# Patient Record
Sex: Female | Born: 1990 | Race: Black or African American | Hispanic: No | Marital: Single | Smoking: Never smoker
Health system: Northeastern US, Academic
[De-identification: ages and names within clinical notes are randomized; demographics above are authoritative.]

## PROBLEM LIST (undated history)

## (undated) ENCOUNTER — Encounter

## (undated) DIAGNOSIS — O020 Blighted ovum and nonhydatidiform mole: Secondary | ICD-10-CM

---

## 2013-02-18 NOTE — L&D Delivery Note (Signed)
Delivery Note At [phone] a healthy female was delivered via spontaneous vaginal delivery (Presentation: OA ;  ). .   Placenta status: intact.  Cord: 3 vessel  Called at 0645 with alert that patient was pushing. Arrived in room at 0647 to find baby delivered in bed, nursing staff attended delivery. Cord was cut and clamped by nursing staff. Placenta was delivered with gentle traction. Blood loss was minimal. Uterus was firm. There was a small perineal laceration that was not bleeding that did not require repair. No other lacerations noted. Bleeding was stopped. Mom and baby in good condition. Ready for transfer to postpartum  Anesthesia:  Fentanyl Episiotomy: None Lacerations: perineal 1st degree Suture Repair: none Est. Blood Loss (mL): 100  Mom to postpartum.  Baby to Nursery.  [name] attended delivery [name], [name] Surgeon A 09/25/2013, 6:59 AM

## 2013-02-18 NOTE — L&D Delivery Note (Signed)
I have seen and examined this patient and I agree with the above. Cam HaiSHAW, Tehila Sokolow CNM 8:49 AM 09/25/2013

## 2013-05-31 ENCOUNTER — Other Ambulatory Visit (HOSPITAL_COMMUNITY): Payer: Self-pay | Admitting: Nurse Practitioner

## 2013-05-31 DIAGNOSIS — Z3689 Encounter for other specified antenatal screening: Secondary | ICD-10-CM

## 2013-05-31 LAB — OB RESULTS CONSOLE GC/CHLAMYDIA: Gonorrhea: NEGATIVE

## 2013-05-31 LAB — OB RESULTS CONSOLE HGB/HCT, BLOOD
HEMATOCRIT: 34 %
Hemoglobin: 11 g/dL

## 2013-05-31 LAB — OB RESULTS CONSOLE RPR: RPR: NONREACTIVE

## 2013-05-31 LAB — GLUCOSE TOLERANCE, 1 HOUR: Glucose, 1 Hour GTT: 100

## 2013-05-31 LAB — OB RESULTS CONSOLE RUBELLA ANTIBODY, IGM: RUBELLA: IMMUNE

## 2013-05-31 LAB — OB RESULTS CONSOLE ABO/RH: RH Type: POSITIVE

## 2013-05-31 LAB — CULTURE, OB URINE: Urine Culture, OB: NEGATIVE

## 2013-05-31 LAB — OB RESULTS CONSOLE VARICELLA ZOSTER ANTIBODY, IGG: Varicella: IMMUNE

## 2013-05-31 LAB — OB RESULTS CONSOLE PLATELET COUNT: Platelets: 199 10*3/uL

## 2013-05-31 LAB — OB RESULTS CONSOLE HEPATITIS B SURFACE ANTIGEN: Hepatitis B Surface Ag: NEGATIVE

## 2013-05-31 LAB — DRUG SCREEN, URINE: Drug Screen, Urine: NEGATIVE

## 2013-05-31 LAB — CYSTIC FIBROSIS DIAGNOSTIC STUDY: INTERPRETATION-CFDNA: NEGATIVE

## 2013-05-31 LAB — OB RESULTS CONSOLE HIV ANTIBODY (ROUTINE TESTING): HIV: NONREACTIVE

## 2013-05-31 LAB — OB RESULTS CONSOLE ANTIBODY SCREEN: Antibody Screen: NEGATIVE

## 2013-05-31 LAB — SICKLE CELL SCREEN: SICKLE CELL SCREEN: NORMAL

## 2013-05-31 LAB — CYTOLOGY - PAP: PAP SMEAR: NEGATIVE

## 2013-06-07 ENCOUNTER — Ambulatory Visit (HOSPITAL_COMMUNITY)
Admission: RE | Admit: 2013-06-07 | Discharge: 2013-06-07 | Disposition: A | Discharge status: Home / Self Care | Payer: Medicaid Other | Source: Ambulatory Visit | Attending: Nurse Practitioner | Admitting: Nurse Practitioner

## 2013-06-07 ENCOUNTER — Other Ambulatory Visit (HOSPITAL_COMMUNITY): Payer: Self-pay | Admitting: Nurse Practitioner

## 2013-06-07 ENCOUNTER — Encounter (HOSPITAL_COMMUNITY): Payer: Self-pay

## 2013-06-07 DIAGNOSIS — Z3689 Encounter for other specified antenatal screening: Secondary | ICD-10-CM

## 2013-06-14 ENCOUNTER — Other Ambulatory Visit (HOSPITAL_COMMUNITY): Payer: Self-pay | Admitting: Nurse Practitioner

## 2013-06-14 DIAGNOSIS — Z0374 Encounter for suspected problem with fetal growth ruled out: Secondary | ICD-10-CM

## 2013-06-28 ENCOUNTER — Ambulatory Visit (HOSPITAL_COMMUNITY)
Admission: RE | Admit: 2013-06-28 | Discharge: 2013-06-28 | Disposition: A | Discharge status: Home / Self Care | Payer: Medicaid Other | Source: Ambulatory Visit | Attending: Nurse Practitioner | Admitting: Nurse Practitioner

## 2013-06-28 ENCOUNTER — Other Ambulatory Visit (HOSPITAL_COMMUNITY): Payer: Self-pay | Admitting: Nurse Practitioner

## 2013-06-28 DIAGNOSIS — Z0374 Encounter for suspected problem with fetal growth ruled out: Secondary | ICD-10-CM | POA: Diagnosis not present

## 2013-06-28 DIAGNOSIS — Z3689 Encounter for other specified antenatal screening: Secondary | ICD-10-CM | POA: Diagnosis not present

## 2013-06-28 LAB — OB RESULTS CONSOLE GC/CHLAMYDIA: Chlamydia: NEGATIVE

## 2013-07-02 ENCOUNTER — Other Ambulatory Visit (HOSPITAL_COMMUNITY): Payer: Self-pay | Admitting: Nurse Practitioner

## 2013-07-02 DIAGNOSIS — O365990 Maternal care for other known or suspected poor fetal growth, unspecified trimester, not applicable or unspecified: Secondary | ICD-10-CM

## 2013-07-05 ENCOUNTER — Ambulatory Visit (HOSPITAL_COMMUNITY)
Admission: RE | Admit: 2013-07-05 | Discharge: 2013-07-05 | Disposition: A | Discharge status: Home / Self Care | Payer: Medicaid Other | Source: Ambulatory Visit | Attending: Nurse Practitioner | Admitting: Nurse Practitioner

## 2013-07-05 ENCOUNTER — Encounter (HOSPITAL_COMMUNITY): Payer: Self-pay

## 2013-07-05 DIAGNOSIS — Z3689 Encounter for other specified antenatal screening: Secondary | ICD-10-CM | POA: Insufficient documentation

## 2013-07-05 DIAGNOSIS — O365990 Maternal care for other known or suspected poor fetal growth, unspecified trimester, not applicable or unspecified: Secondary | ICD-10-CM | POA: Insufficient documentation

## 2013-07-05 LAB — GLUCOSE TOLERANCE, 1 HOUR: Glucose, 1 Hour GTT: 86

## 2013-07-05 LAB — OB RESULTS CONSOLE HGB/HCT, BLOOD
HCT: 33 %
Hemoglobin: 10.8 g/dL

## 2013-07-13 ENCOUNTER — Ambulatory Visit (HOSPITAL_COMMUNITY)
Admission: RE | Admit: 2013-07-13 | Discharge: 2013-07-13 | Disposition: A | Discharge status: Home / Self Care | Payer: Medicaid Other | Source: Ambulatory Visit | Attending: Nurse Practitioner | Admitting: Nurse Practitioner

## 2013-07-13 ENCOUNTER — Encounter (HOSPITAL_COMMUNITY): Payer: Self-pay

## 2013-07-13 VITALS — BP 107/62 | HR 89 | Wt 194.0 lb

## 2013-07-13 DIAGNOSIS — Z3689 Encounter for other specified antenatal screening: Secondary | ICD-10-CM | POA: Insufficient documentation

## 2013-07-13 DIAGNOSIS — O365990 Maternal care for other known or suspected poor fetal growth, unspecified trimester, not applicable or unspecified: Secondary | ICD-10-CM

## 2013-07-13 NOTE — Progress Notes (Signed)
[facility] ultrasound  Indication: 23 yr old G3P1011 at [redacted]w[redacted]d by early ultrasound with concern for lagging fetal growth for BPP and Doppler studies.  Findings: 1. Single intrauterine pregnancy. 2. Posterior placenta without evidence of previa. 3. Normal amniotic fluid volume. 4. Normal umbilical artery Doppler studies. 5. Biophysical profile is 6/8 (-2 for breathing).  Recommendations: 1. Concern for lagging fetal growth: - new due date discovered in patient's records from early ultrasound - repeat fetal growth in 1 week using new due date 2. Overall  BPP 8/10 (reactive with 10x10 accels for <32 weeks)  Eulis Foster, MD

## 2013-07-14 ENCOUNTER — Encounter: Payer: Self-pay | Admitting: *Deleted

## 2013-07-15 ENCOUNTER — Encounter: Payer: Self-pay | Admitting: Family

## 2013-07-15 ENCOUNTER — Ambulatory Visit (INDEPENDENT_AMBULATORY_CARE_PROVIDER_SITE_OTHER): Payer: Medicaid Other | Admitting: Family

## 2013-07-15 VITALS — BP 117/73 | HR 104 | Temp 98.5°F | Ht 63.0 in | Wt 192.5 lb

## 2013-07-15 DIAGNOSIS — O0990 Supervision of high risk pregnancy, unspecified, unspecified trimester: Secondary | ICD-10-CM | POA: Insufficient documentation

## 2013-07-15 DIAGNOSIS — O365990 Maternal care for other known or suspected poor fetal growth, unspecified trimester, not applicable or unspecified: Secondary | ICD-10-CM

## 2013-07-15 LAB — POCT URINALYSIS DIP (DEVICE)
Bilirubin Urine: NEGATIVE
GLUCOSE, UA: NEGATIVE mg/dL
HGB URINE DIPSTICK: NEGATIVE
KETONES UR: NEGATIVE mg/dL
Leukocytes, UA: NEGATIVE
Nitrite: NEGATIVE
Protein, ur: 30 mg/dL — AB
SPECIFIC GRAVITY, URINE: 1.015 (ref 1.005–1.030)
UROBILINOGEN UA: 0.2 mg/dL (ref 0.0–1.0)
pH: 7.5 (ref 5.0–8.0)

## 2013-07-15 NOTE — Progress Notes (Signed)
[facility], getting weekly BPPs/dopplers, otherwise pregnancy uncomplicated.  Reviewed labs and updated problem list.  Next BPP on 07/20/13

## 2013-07-15 NOTE — Progress Notes (Signed)
Initial visit. Transfer from [facility]. Up to date on all labs.  Reports occasional edema in feet.  New OB packet given.

## 2013-07-20 ENCOUNTER — Ambulatory Visit (HOSPITAL_COMMUNITY)
Admission: RE | Admit: 2013-07-20 | Discharge: 2013-07-20 | Disposition: A | Discharge status: Home / Self Care | Payer: Medicaid Other | Attending: Family | Admitting: Family

## 2013-07-20 ENCOUNTER — Encounter (HOSPITAL_COMMUNITY): Payer: Self-pay

## 2013-07-20 DIAGNOSIS — O365990 Maternal care for other known or suspected poor fetal growth, unspecified trimester, not applicable or unspecified: Secondary | ICD-10-CM | POA: Insufficient documentation

## 2013-07-20 DIAGNOSIS — Z3689 Encounter for other specified antenatal screening: Secondary | ICD-10-CM | POA: Insufficient documentation

## 2013-07-29 ENCOUNTER — Ambulatory Visit (INDEPENDENT_AMBULATORY_CARE_PROVIDER_SITE_OTHER): Payer: Medicaid Other | Admitting: Obstetrics and Gynecology

## 2013-07-29 ENCOUNTER — Encounter: Payer: Self-pay | Admitting: Obstetrics and Gynecology

## 2013-07-29 VITALS — BP 120/82 | HR 109 | Temp 97.0°F | Wt 190.6 lb

## 2013-07-29 DIAGNOSIS — O0990 Supervision of high risk pregnancy, unspecified, unspecified trimester: Secondary | ICD-10-CM

## 2013-07-29 DIAGNOSIS — O365990 Maternal care for other known or suspected poor fetal growth, unspecified trimester, not applicable or unspecified: Secondary | ICD-10-CM

## 2013-07-29 LAB — POCT URINALYSIS DIP (DEVICE)
BILIRUBIN URINE: NEGATIVE
Glucose, UA: NEGATIVE mg/dL
HGB URINE DIPSTICK: NEGATIVE
KETONES UR: NEGATIVE mg/dL
NITRITE: NEGATIVE
Protein, ur: 30 mg/dL — AB
SPECIFIC GRAVITY, URINE: 1.025 (ref 1.005–1.030)
Urobilinogen, UA: 0.2 mg/dL (ref 0.0–1.0)
pH: 7 (ref 5.0–8.0)

## 2013-07-29 NOTE — Progress Notes (Signed)
EFW 51%tile on 07/20/2013- no need for further growth ultrasound as per MFM. FM/PTL precautions reviewed. Reassurance provided regarding edema.  NST reviewed and reactive. No further need for fetal monitoring at this time

## 2013-07-29 NOTE — Progress Notes (Signed)
Reports pain in left breast "nipple was so hard and I was feeling intense pain" yesterday-- patient reports massaging breast and pain eventually subsided. Reports edema in hands and feet.

## 2013-08-12 ENCOUNTER — Ambulatory Visit (INDEPENDENT_AMBULATORY_CARE_PROVIDER_SITE_OTHER): Payer: Medicaid Other | Admitting: Advanced Practice Midwife

## 2013-08-12 VITALS — BP 103/70 | HR 94 | Temp 97.6°F | Wt 192.4 lb

## 2013-08-12 DIAGNOSIS — O365990 Maternal care for other known or suspected poor fetal growth, unspecified trimester, not applicable or unspecified: Secondary | ICD-10-CM

## 2013-08-12 DIAGNOSIS — R42 Dizziness and giddiness: Secondary | ICD-10-CM

## 2013-08-12 LAB — POCT URINALYSIS DIP (DEVICE)
BILIRUBIN URINE: NEGATIVE
Glucose, UA: NEGATIVE mg/dL
Hgb urine dipstick: NEGATIVE
KETONES UR: NEGATIVE mg/dL
LEUKOCYTES UA: NEGATIVE
Nitrite: NEGATIVE
PH: 7 (ref 5.0–8.0)
Protein, ur: NEGATIVE mg/dL
SPECIFIC GRAVITY, URINE: 1.01 (ref 1.005–1.030)
Urobilinogen, UA: 0.2 mg/dL (ref 0.0–1.0)

## 2013-08-12 LAB — CBC
HEMATOCRIT: 30.3 % — AB (ref 36.0–46.0)
HEMOGLOBIN: 10.2 g/dL — AB (ref 12.0–15.0)
MCH: 26.6 pg (ref 26.0–34.0)
MCHC: 33.7 g/dL (ref 30.0–36.0)
MCV: 78.9 fL (ref 78.0–100.0)
Platelets: 157 10*3/uL (ref 150–400)
RBC: 3.84 MIL/uL — ABNORMAL LOW (ref 3.87–5.11)
RDW: 14.3 % (ref 11.5–15.5)
WBC: 8 10*3/uL (ref 4.0–10.5)

## 2013-08-12 NOTE — Progress Notes (Signed)
Doing well.  Good fetal movement, denies vaginal bleeding, LOF, regular contractions. Does report intermittent dizziness when up and walking and especially in heat, even when well hydrated.  Reports she has been anemic in the past and [facility] (hgb 11).  She is taking PNV daily.  CBC today.

## 2013-08-17 ENCOUNTER — Telehealth: Payer: Self-pay | Admitting: General Practice

## 2013-08-17 NOTE — Telephone Encounter (Signed)
Message copied by Kathee DeltonHILLMAN, CARRIE L on Tue Aug 17, 2013  8:06 AM ------      Message from: LEFTWICH-KIRBY, LISA A      Created: Fri Aug 13, 2013 10:30 AM       G3P1011 @[redacted]w[redacted]d  reported in her clinic visit on 6/25 that she was having occasional dizziness when standing.  She indicated that she had a hx of anemia.  CBC done that day indicates hgb of 10.2, which is only mildly anemic.  Usually we do not treat, but she may take OTC iron supplement, in addition to PNV to see if her symptoms improve. Please call to let her know results.   Thank you. ------

## 2013-08-17 NOTE — Telephone Encounter (Signed)
Called patient, no answer- left message that we are trying to reach you with some results, nothing urgent but please give us a call back at the clinics

## 2013-08-18 ENCOUNTER — Encounter: Payer: Self-pay | Admitting: General Practice

## 2013-08-18 NOTE — Telephone Encounter (Addendum)
Called patient, no answer- left message that we are trying to reach you with some results, nothing urgent but please call us back at the clinics. Will send letter 

## 2013-09-01 ENCOUNTER — Encounter: Payer: Medicaid Other | Admitting: Family

## 2013-09-02 ENCOUNTER — Ambulatory Visit (INDEPENDENT_AMBULATORY_CARE_PROVIDER_SITE_OTHER): Payer: Medicaid Other | Admitting: Advanced Practice Midwife

## 2013-09-02 VITALS — BP 125/77 | HR 93 | Temp 97.9°F | Wt 197.1 lb

## 2013-09-02 DIAGNOSIS — Z3480 Encounter for supervision of other normal pregnancy, unspecified trimester: Secondary | ICD-10-CM

## 2013-09-02 DIAGNOSIS — Z3493 Encounter for supervision of normal pregnancy, unspecified, third trimester: Secondary | ICD-10-CM

## 2013-09-02 LAB — POCT URINALYSIS DIP (DEVICE)
Bilirubin Urine: NEGATIVE
GLUCOSE, UA: NEGATIVE mg/dL
Hgb urine dipstick: NEGATIVE
KETONES UR: NEGATIVE mg/dL
LEUKOCYTES UA: NEGATIVE
Nitrite: NEGATIVE
Protein, ur: NEGATIVE mg/dL
SPECIFIC GRAVITY, URINE: 1.01 (ref 1.005–1.030)
Urobilinogen, UA: 0.2 mg/dL (ref 0.0–1.0)
pH: 7 (ref 5.0–8.0)

## 2013-09-02 LAB — OB RESULTS CONSOLE GC/CHLAMYDIA
CHLAMYDIA, DNA PROBE: NEGATIVE
Gonorrhea: NEGATIVE

## 2013-09-02 LAB — OB RESULTS CONSOLE GBS: GBS: POSITIVE

## 2013-09-02 NOTE — Patient Instructions (Signed)
Third Trimester of Pregnancy The third trimester is from week 29 through week 42, months 7 through 9. The third trimester is a time when the fetus is growing rapidly. At the end of the ninth month, the fetus is about 20 inches in length and weighs 6-10 pounds.  BODY CHANGES Your body goes through many changes during pregnancy. The changes vary from woman to woman.   Your weight will continue to increase. You can expect to gain 25-35 pounds (11-16 kg) by the end of the pregnancy.  You may begin to get stretch marks on your hips, abdomen, and breasts.  You may urinate more often because the fetus is moving lower into your pelvis and pressing on your bladder.  You may develop or continue to have heartburn as a result of your pregnancy.  You may develop constipation because certain hormones are causing the muscles that push waste through your intestines to slow down.  You may develop hemorrhoids or swollen, bulging veins (varicose veins).  You may have pelvic pain because of the weight gain and pregnancy hormones relaxing your joints between the bones in your pelvis. Backaches may result from overexertion of the muscles supporting your posture.  You may have changes in your hair. These can include thickening of your hair, rapid growth, and changes in texture. Some women also have hair loss during or after pregnancy, or hair that feels dry or thin. Your hair will most likely return to normal after your baby is born.  Your breasts will continue to grow and be tender. A yellow discharge may leak from your breasts called colostrum.  Your belly button may stick out.  You may feel short of breath because of your expanding uterus.  You may notice the fetus "dropping," or moving lower in your abdomen.  You may have a bloody mucus discharge. This usually occurs a few days to a week before labor begins.  Your cervix becomes thin and soft (effaced) near your due date. WHAT TO EXPECT AT YOUR PRENATAL  EXAMS  You will have prenatal exams every 2 weeks until week 36. Then, you will have weekly prenatal exams. During a routine prenatal visit:  You will be weighed to make sure you and the fetus are growing normally.  Your blood pressure is taken.  Your abdomen will be measured to track your baby's growth.  The fetal heartbeat will be listened to.  Any test results from the previous visit will be discussed.  You may have a cervical check near your due date to see if you have effaced. At around 36 weeks, your caregiver will check your cervix. At the same time, your caregiver will also perform a test on the secretions of the vaginal tissue. This test is to determine if a type of bacteria, Group B streptococcus, is present. Your caregiver will explain this further. Your caregiver may ask you:  What your birth plan is.  How you are feeling.  If you are feeling the baby move.  If you have had any abnormal symptoms, such as leaking fluid, bleeding, severe headaches, or abdominal cramping.  If you have any questions. Other tests or screenings that may be performed during your third trimester include:  Blood tests that check for low iron levels (anemia).  Fetal testing to check the health, activity level, and growth of the fetus. Testing is done if you have certain medical conditions or if there are problems during the pregnancy. FALSE LABOR You may feel small, irregular contractions that   eventually go away. These are called Braxton Hicks contractions, or false labor. Contractions may last for hours, days, or even weeks before true labor sets in. If contractions come at regular intervals, intensify, or become painful, it is best to be seen by your caregiver.  SIGNS OF LABOR   Menstrual-like cramps.  Contractions that are 5 minutes apart or less.  Contractions that start on the top of the uterus and spread down to the lower abdomen and back.  A sense of increased pelvic pressure or back  pain.  A watery or bloody mucus discharge that comes from the vagina. If you have any of these signs before the 37th week of pregnancy, call your caregiver right away. You need to go to the hospital to get checked immediately. HOME CARE INSTRUCTIONS   Avoid all smoking, herbs, alcohol, and unprescribed drugs. These chemicals affect the formation and growth of the baby.  Follow your caregiver's instructions regarding medicine use. There are medicines that are either safe or unsafe to take during pregnancy.  Exercise only as directed by your caregiver. Experiencing uterine cramps is a good sign to stop exercising.  Continue to eat regular, healthy meals.  Wear a good support bra for breast tenderness.  Do not use hot tubs, steam rooms, or saunas.  Wear your seat belt at all times when driving.  Avoid raw meat, uncooked cheese, cat litter boxes, and soil used by cats. These carry germs that can cause birth defects in the baby.  Take your prenatal vitamins.  Try taking a stool softener (if your caregiver approves) if you develop constipation. Eat more high-fiber foods, such as fresh vegetables or fruit and whole grains. Drink plenty of fluids to keep your urine clear or pale yellow.  Take warm sitz baths to soothe any pain or discomfort caused by hemorrhoids. Use hemorrhoid cream if your caregiver approves.  If you develop varicose veins, wear support hose. Elevate your feet for 15 minutes, 3-4 times a day. Limit salt in your diet.  Avoid heavy lifting, wear low heal shoes, and practice good posture.  Rest a lot with your legs elevated if you have leg cramps or low back pain.  Visit your dentist if you have not gone during your pregnancy. Use a soft toothbrush to brush your teeth and be gentle when you floss.  A sexual relationship may be continued unless your caregiver directs you otherwise.  Do not travel far distances unless it is absolutely necessary and only with the approval  of your caregiver.  Take prenatal classes to understand, practice, and ask questions about the labor and delivery.  Make a trial run to the hospital.  Pack your hospital bag.  Prepare the baby's nursery.  Continue to go to all your prenatal visits as directed by your caregiver. SEEK MEDICAL CARE IF:  You are unsure if you are in labor or if your water has broken.  You have dizziness.  You have mild pelvic cramps, pelvic pressure, or nagging pain in your abdominal area.  You have persistent nausea, vomiting, or diarrhea.  You have a bad smelling vaginal discharge.  You have pain with urination. SEEK IMMEDIATE MEDICAL CARE IF:   You have a fever.  You are leaking fluid from your vagina.  You have spotting or bleeding from your vagina.  You have severe abdominal cramping or pain.  You have rapid weight loss or gain.  You have shortness of breath with chest pain.  You notice sudden or extreme swelling   of your face, hands, ankles, feet, or legs.  You have not felt your baby move in over an hour.  You have severe headaches that do not go away with medicine.  You have vision changes. Document Released: 01/29/2001 Document Revised: 02/09/2013 Document Reviewed: 04/07/2012 [employer] Patient Information 2015 [employer]. This information is not intended to replace advice given to you by your health care provider. Make sure you discuss any questions you have with your health care provider.  

## 2013-09-02 NOTE — Progress Notes (Signed)
Doing well. Cultures done. Having some contractions.

## 2013-09-02 NOTE — Progress Notes (Signed)
Patient reports pelvic/vaginal pain

## 2013-09-04 LAB — GC/CHLAMYDIA PROBE AMP
CT PROBE, AMP APTIMA: NEGATIVE
GC PROBE AMP APTIMA: NEGATIVE

## 2013-09-04 LAB — CULTURE, BETA STREP (GROUP B ONLY)

## 2013-09-08 ENCOUNTER — Ambulatory Visit (INDEPENDENT_AMBULATORY_CARE_PROVIDER_SITE_OTHER): Payer: Medicaid Other | Admitting: Advanced Practice Midwife

## 2013-09-08 VITALS — BP 99/76 | HR 92 | Temp 98.3°F | Wt 199.5 lb

## 2013-09-08 DIAGNOSIS — Z22330 Carrier of Group B streptococcus: Secondary | ICD-10-CM

## 2013-09-08 DIAGNOSIS — O365990 Maternal care for other known or suspected poor fetal growth, unspecified trimester, not applicable or unspecified: Secondary | ICD-10-CM

## 2013-09-08 DIAGNOSIS — O09899 Supervision of other high risk pregnancies, unspecified trimester: Secondary | ICD-10-CM

## 2013-09-08 DIAGNOSIS — O0991 Supervision of high risk pregnancy, unspecified, first trimester: Secondary | ICD-10-CM

## 2013-09-08 DIAGNOSIS — O99820 Streptococcus B carrier state complicating pregnancy: Secondary | ICD-10-CM | POA: Insufficient documentation

## 2013-09-08 LAB — POCT URINALYSIS DIP (DEVICE)
BILIRUBIN URINE: NEGATIVE
Glucose, UA: NEGATIVE mg/dL
HGB URINE DIPSTICK: NEGATIVE
Ketones, ur: NEGATIVE mg/dL
Leukocytes, UA: NEGATIVE
NITRITE: NEGATIVE
Protein, ur: NEGATIVE mg/dL
Specific Gravity, Urine: 1.01 (ref 1.005–1.030)
Urobilinogen, UA: 0.2 mg/dL (ref 0.0–1.0)
pH: 7 (ref 5.0–8.0)

## 2013-09-08 NOTE — Patient Instructions (Signed)
Contraception Choices Contraception (birth control) is the use of any methods or devices to prevent pregnancy. Below are some methods to help avoid pregnancy. HORMONAL METHODS   Contraceptive implant. This is a thin, plastic tube containing progesterone hormone. It does not contain estrogen hormone. Your health care provider inserts the tube in the inner part of the upper arm. The tube can remain in place for up to 3 years. After 3 years, the implant must be removed. The implant prevents the ovaries from releasing an egg (ovulation), thickens the cervical mucus to prevent sperm from entering the uterus, and thins the lining of the inside of the uterus.  Progesterone-only injections. These injections are given every 3 months by your health care provider to prevent pregnancy. This synthetic progesterone hormone stops the ovaries from releasing eggs. It also thickens cervical mucus and changes the uterine lining. This makes it harder for sperm to survive in the uterus.  Birth control pills. These pills contain estrogen and progesterone hormone. They work by preventing the ovaries from releasing eggs (ovulation). They also cause the cervical mucus to thicken, preventing the sperm from entering the uterus. Birth control pills are prescribed by a health care provider.Birth control pills can also be used to treat heavy periods.  Minipill. This type of birth control pill contains only the progesterone hormone. They are taken every day of each month and must be prescribed by your health care provider.  Birth control patch. The patch contains hormones similar to those in birth control pills. It must be changed once a week and is prescribed by a health care provider.  Vaginal ring. The ring contains hormones similar to those in birth control pills. It is left in the vagina for 3 weeks, removed for 1 week, and then a new one is put back in place. The patient must be comfortable inserting and removing the ring  from the vagina.A health care provider's prescription is necessary.  Emergency contraception. Emergency contraceptives prevent pregnancy after unprotected sexual intercourse. This pill can be taken right after sex or up to 5 days after unprotected sex. It is most effective the sooner you take the pills after having sexual intercourse. Most emergency contraceptive pills are available without a prescription. Check with your pharmacist. Do not use emergency contraception as your only form of birth control. BARRIER METHODS   Female condom. This is a thin sheath (latex or rubber) that is worn over the penis during sexual intercourse. It can be used with spermicide to increase effectiveness.  Female condom. This is a soft, loose-fitting sheath that is put into the vagina before sexual intercourse.  Diaphragm. This is a soft, latex, dome-shaped barrier that must be fitted by a health care provider. It is inserted into the vagina, along with a spermicidal jelly. It is inserted before intercourse. The diaphragm should be left in the vagina for 6 to 8 hours after intercourse.  Cervical cap. This is a round, soft, latex or plastic cup that fits over the cervix and must be fitted by a health care provider. The cap can be left in place for up to 48 hours after intercourse.  Sponge. This is a soft, circular piece of polyurethane foam. The sponge has spermicide in it. It is inserted into the vagina after wetting it and before sexual intercourse.  Spermicides. These are chemicals that kill or block sperm from entering the cervix and uterus. They come in the form of creams, jellies, suppositories, foam, or tablets. They do not require a   prescription. They are inserted into the vagina with an applicator before having sexual intercourse. The process must be repeated every time you have sexual intercourse. INTRAUTERINE CONTRACEPTION  Intrauterine device (IUD). This is a T-shaped device that is put in a woman's uterus  during a menstrual period to prevent pregnancy. There are 2 types:  Copper IUD. This type of IUD is wrapped in copper wire and is placed inside the uterus. Copper makes the uterus and fallopian tubes produce a fluid that kills sperm. It can stay in place for 10 years.  Hormone IUD. This type of IUD contains the hormone progestin (synthetic progesterone). The hormone thickens the cervical mucus and prevents sperm from entering the uterus, and it also thins the uterine lining to prevent implantation of a fertilized egg. The hormone can weaken or kill the sperm that get into the uterus. It can stay in place for 3-5 years, depending on which type of IUD is used. PERMANENT METHODS OF CONTRACEPTION  Female tubal ligation. This is when the woman's fallopian tubes are surgically sealed, tied, or blocked to prevent the egg from traveling to the uterus.  Hysteroscopic sterilization. This involves placing a small coil or insert into each fallopian tube. Your doctor uses a technique called hysteroscopy to do the procedure. The device causes scar tissue to form. This results in permanent blockage of the fallopian tubes, so the sperm cannot fertilize the egg. It takes about 3 months after the procedure for the tubes to become blocked. You must use another form of birth control for these 3 months.  Female sterilization. This is when the female has the tubes that carry sperm tied off (vasectomy).This blocks sperm from entering the vagina during sexual intercourse. After the procedure, the man can still ejaculate fluid (semen). NATURAL PLANNING METHODS  Natural family planning. This is not having sexual intercourse or using a barrier method (condom, diaphragm, cervical cap) on days the woman could become pregnant.  Calendar method. This is keeping track of the length of each menstrual cycle and identifying when you are fertile.  Ovulation method. This is avoiding sexual intercourse during ovulation.  Symptothermal  method. This is avoiding sexual intercourse during ovulation, using a thermometer and ovulation symptoms.  Post-ovulation method. This is timing sexual intercourse after you have ovulated. Regardless of which type or method of contraception you choose, it is important that you use condoms to protect against the transmission of sexually transmitted infections (STIs). Talk with your health care provider about which form of contraception is most appropriate for you. Document Released: 02/04/2005 Document Revised: 02/09/2013 Document Reviewed: 07/30/2012 [facility] Patient Information 2015 [place], [place]. This information is not intended to replace advice given to you by your health care provider. Make sure you discuss any questions you have with your health care provider.  Braxton Hicks Contractions Contractions of the uterus can occur throughout pregnancy. Contractions are not always a sign that you are in labor.  WHAT ARE BRAXTON HICKS CONTRACTIONS?  Contractions that occur before labor are called Braxton Hicks contractions, or false labor. Toward the end of pregnancy (32-34 weeks), these contractions can develop more often and may become more forceful. This is not true labor because these contractions do not result in opening (dilatation) and thinning of the cervix. They are sometimes difficult to tell apart from true labor because these contractions can be forceful and people have different pain tolerances. You should not feel embarrassed if you go to the hospital with false labor. Sometimes, the only way to  tell if you are in true labor is for your health care provider to look for changes in the cervix. If there are no prenatal problems or other health problems associated with the pregnancy, it is completely safe to be sent home with false labor and await the onset of true labor. HOW CAN YOU TELL THE DIFFERENCE BETWEEN TRUE AND FALSE LABOR? False Labor  The contractions of false labor are usually  shorter and not as hard as those of true labor.   The contractions are usually irregular.   The contractions are often felt in the front of the lower abdomen and in the groin.   The contractions may go away when you walk around or change positions while lying down.   The contractions get weaker and are shorter lasting as time goes on.   The contractions do not usually become progressively stronger, regular, and closer together as with true labor.  True Labor  Contractions in true labor last 30-70 seconds, become very regular, usually become more intense, and increase in frequency.   The contractions do not go away with walking.   The discomfort is usually felt in the top of the uterus and spreads to the lower abdomen and low back.   True labor can be determined by your health care provider with an exam. This will show that the cervix is dilating and getting thinner.  WHAT TO REMEMBER  Keep up with your usual exercises and follow other instructions given by your health care provider.   Take medicines as directed by your health care provider.   Keep your regular prenatal appointments.   Eat and drink lightly if you think you are going into labor.   If Braxton Hicks contractions are making you uncomfortable:   Change your position from lying down or resting to walking, or from walking to resting.   Sit and rest in a tub of warm water.   Drink 2-3 glasses of water. Dehydration may cause these contractions.   Do slow and deep breathing several times an hour.  WHEN SHOULD I SEEK IMMEDIATE MEDICAL CARE? Seek immediate medical care if:  Your contractions become stronger, more regular, and closer together.   You have fluid leaking or gushing from your vagina.   You have a fever.   You pass blood-tinged mucus.   You have vaginal bleeding.   You have continuous abdominal pain.   You have low back pain that you never had before.   You feel your  baby's head pushing down and causing pelvic pressure.   Your baby is not moving as much as it used to.  Document Released: 02/04/2005 Document Revised: 02/09/2013 Document Reviewed: 11/16/2012 [facility] Patient Information 2015 [place]. This information is not intended to replace advice given to you by your health care provider. Make sure you discuss any questions you have with your health care provider.  Fetal Movement Counts Patient Name: __________________________________________________ Patient Due Date: ____________________ Performing a fetal movement count is highly recommended in high-risk pregnancies, but it is good for every pregnant woman to do. Your caregiver may ask you to start counting fetal movements at 28 weeks of the pregnancy. Fetal movements often increase:  After eating a full meal.  After physical activity.  After eating or drinking something sweet or cold.  At rest. Pay attention to when you feel the baby is most active. This will help you notice a pattern of your baby's sleep and wake cycles and what factors contribute to an  increase in fetal movement. It is important to perform a fetal movement count at the same time each day when your baby is normally most active.  HOW TO COUNT FETAL MOVEMENTS 1. Find a quiet and comfortable area to sit or lie down on your left side. Lying on your left side provides the best blood and oxygen circulation to your baby. 2. Write down the day and time on a sheet of paper or in a journal. 3. Start counting kicks, flutters, swishes, rolls, or jabs in a 2 hour period. You should feel at least 10 movements within 2 hours. 4. If you do not feel 10 movements in 2 hours, wait 2-3 hours and count again. Look for a change in the pattern or not enough counts in 2 hours. SEEK MEDICAL CARE IF:  You feel less than 10 counts in 2 hours, tried twice.  There is no movement in over an hour.  The pattern is changing or taking longer each day  to reach 10 counts in 2 hours.  You feel the baby is not moving as he or she usually does. Date: ____________ Movements: ____________ Start time: ____________ Doreatha Martin time: ____________  Date: ____________ Movements: ____________ Start time: ____________ Doreatha Martin time: ____________ Date: ____________ Movements: ____________ Start time: ____________ Doreatha Martin time: ____________ Date: ____________ Movements: ____________ Start time: ____________ Doreatha Martin time: ____________ Date: ____________ Movements: ____________ Start time: ____________ Doreatha Martin time: ____________ Date: ____________ Movements: ____________ Start time: ____________ Doreatha Martin time: ____________ Date: ____________ Movements: ____________ Start time: ____________ Doreatha Martin time: ____________ Date: ____________ Movements: ____________ Start time: ____________ Doreatha Martin time: ____________  Date: ____________ Movements: ____________ Start time: ____________ Doreatha Martin time: ____________ Date: ____________ Movements: ____________ Start time: ____________ Doreatha Martin time: ____________ Date: ____________ Movements: ____________ Start time: ____________ Doreatha Martin time: ____________ Date: ____________ Movements: ____________ Start time: ____________ Doreatha Martin time: ____________ Date: ____________ Movements: ____________ Start time: ____________ Doreatha Martin time: ____________ Date: ____________ Movements: ____________ Start time: ____________ Doreatha Martin time: ____________ Date: ____________ Movements: ____________ Start time: ____________ Doreatha Martin time: ____________  Date: ____________ Movements: ____________ Start time: ____________ Doreatha Martin time: ____________ Date: ____________ Movements: ____________ Start time: ____________ Doreatha Martin time: ____________ Date: ____________ Movements: ____________ Start time: ____________ Doreatha Martin time: ____________ Date: ____________ Movements: ____________ Start time: ____________ Doreatha Martin time: ____________ Date: ____________ Movements: ____________  Start time: ____________ Doreatha Martin time: ____________ Date: ____________ Movements: ____________ Start time: ____________ Doreatha Martin time: ____________ Date: ____________ Movements: ____________ Start time: ____________ Doreatha Martin time: ____________  Date: ____________ Movements: ____________ Start time: ____________ Doreatha Martin time: ____________ Date: ____________ Movements: ____________ Start time: ____________ Doreatha Martin time: ____________ Date: ____________ Movements: ____________ Start time: ____________ Doreatha Martin time: ____________ Date: ____________ Movements: ____________ Start time: ____________ Doreatha Martin time: ____________ Date: ____________ Movements: ____________ Start time: ____________ Doreatha Martin time: ____________ Date: ____________ Movements: ____________ Start time: ____________ Doreatha Martin time: ____________ Date: ____________ Movements: ____________ Start time: ____________ Doreatha Martin time: ____________  Date: ____________ Movements: ____________ Start time: ____________ Doreatha Martin time: ____________ Date: ____________ Movements: ____________ Start time: ____________ Doreatha Martin time: ____________ Date: ____________ Movements: ____________ Start time: ____________ Doreatha Martin time: ____________ Date: ____________ Movements: ____________ Start time: ____________ Doreatha Martin time: ____________ Date: ____________ Movements: ____________ Start time: ____________ Doreatha Martin time: ____________ Date: ____________ Movements: ____________ Start time: ____________ Doreatha Martin time: ____________ Date: ____________ Movements: ____________ Start time: ____________ Doreatha Martin time: ____________  Date: ____________ Movements: ____________ Start time: ____________ Doreatha Martin time: ____________ Date: ____________ Movements: ____________ Start time: ____________ Doreatha Martin time: ____________ Date: ____________ Movements: ____________ Start time: ____________ Doreatha Martin time: ____________ Date: ____________ Movements: ____________ Start time: ____________ Doreatha Martin time:  ____________ Date:  ____________ Movements: ____________ Start time: ____________ Doreatha Martin time: ____________ Date: ____________ Movements: ____________ Start time: ____________ Doreatha Martin time: ____________ Date: ____________ Movements: ____________ Start time: ____________ Doreatha Martin time: ____________  Date: ____________ Movements: ____________ Start time: ____________ Doreatha Martin time: ____________ Date: ____________ Movements: ____________ Start time: ____________ Doreatha Martin time: ____________ Date: ____________ Movements: ____________ Start time: ____________ Doreatha Martin time: ____________ Date: ____________ Movements: ____________ Start time: ____________ Doreatha Martin time: ____________ Date: ____________ Movements: ____________ Start time: ____________ Doreatha Martin time: ____________ Date: ____________ Movements: ____________ Start time: ____________ Doreatha Martin time: ____________ Date: ____________ Movements: ____________ Start time: ____________ Doreatha Martin time: ____________  Date: ____________ Movements: ____________ Start time: ____________ Doreatha Martin time: ____________ Date: ____________ Movements: ____________ Start time: ____________ Doreatha Martin time: ____________ Date: ____________ Movements: ____________ Start time: ____________ Doreatha Martin time: ____________ Date: ____________ Movements: ____________ Start time: ____________ Doreatha Martin time: ____________ Date: ____________ Movements: ____________ Start time: ____________ Doreatha Martin time: ____________ Date: ____________ Movements: ____________ Start time: ____________ Doreatha Martin time: ____________ Document Released: 03/06/2006 Document Revised: 01/22/2012 Document Reviewed: 12/02/2011 [employer] Patient Information 2015 [place]. This information is not intended to replace advice given to you by your health care provider. Make sure you discuss any questions you have with your health care provider.

## 2013-09-08 NOTE — Progress Notes (Signed)
Patient reports pelvic pressure and pain  

## 2013-09-08 NOTE — Progress Notes (Signed)
Active baby. Pos GBS discussed.

## 2013-09-20 ENCOUNTER — Ambulatory Visit (INDEPENDENT_AMBULATORY_CARE_PROVIDER_SITE_OTHER): Payer: Medicaid Other | Admitting: Family Medicine

## 2013-09-20 VITALS — BP 113/70 | HR 90 | Temp 98.3°F | Wt 203.4 lb

## 2013-09-20 DIAGNOSIS — O0990 Supervision of high risk pregnancy, unspecified, unspecified trimester: Secondary | ICD-10-CM

## 2013-09-20 DIAGNOSIS — Z22330 Carrier of Group B streptococcus: Secondary | ICD-10-CM

## 2013-09-20 DIAGNOSIS — O99820 Streptococcus B carrier state complicating pregnancy: Secondary | ICD-10-CM

## 2013-09-20 DIAGNOSIS — O09899 Supervision of other high risk pregnancies, unspecified trimester: Secondary | ICD-10-CM

## 2013-09-20 DIAGNOSIS — O0993 Supervision of high risk pregnancy, unspecified, third trimester: Secondary | ICD-10-CM

## 2013-09-20 LAB — POCT URINALYSIS DIP (DEVICE)
Bilirubin Urine: NEGATIVE
Glucose, UA: NEGATIVE mg/dL
Hgb urine dipstick: NEGATIVE
KETONES UR: NEGATIVE mg/dL
LEUKOCYTES UA: NEGATIVE
NITRITE: NEGATIVE
PROTEIN: NEGATIVE mg/dL
Specific Gravity, Urine: 1.015 (ref 1.005–1.030)
Urobilinogen, UA: 0.2 mg/dL (ref 0.0–1.0)
pH: 7.5 (ref 5.0–8.0)

## 2013-09-21 NOTE — Progress Notes (Signed)
Patient is 23 y.o. [id number] 7711w4d.  +FM, denies LOF, VB, contractions, vaginal discharge.  Overall feeling well.

## 2013-09-25 ENCOUNTER — Inpatient Hospital Stay (HOSPITAL_COMMUNITY)
Admission: AD | Admit: 2013-09-25 | Discharge: 2013-09-27 | DRG: 775 | Disposition: A | Discharge status: Home / Self Care | Payer: Medicaid Other | Source: Ambulatory Visit | Attending: Obstetrics & Gynecology | Admitting: Obstetrics & Gynecology

## 2013-09-25 ENCOUNTER — Encounter (HOSPITAL_COMMUNITY): Payer: Self-pay | Admitting: *Deleted

## 2013-09-25 DIAGNOSIS — Z22330 Carrier of Group B streptococcus: Secondary | ICD-10-CM

## 2013-09-25 DIAGNOSIS — O9989 Other specified diseases and conditions complicating pregnancy, childbirth and the puerperium: Secondary | ICD-10-CM

## 2013-09-25 DIAGNOSIS — Z8249 Family history of ischemic heart disease and other diseases of the circulatory system: Secondary | ICD-10-CM | POA: Diagnosis not present

## 2013-09-25 DIAGNOSIS — IMO0001 Reserved for inherently not codable concepts without codable children: Secondary | ICD-10-CM

## 2013-09-25 DIAGNOSIS — O479 False labor, unspecified: Secondary | ICD-10-CM | POA: Diagnosis present

## 2013-09-25 DIAGNOSIS — O99820 Streptococcus B carrier state complicating pregnancy: Secondary | ICD-10-CM

## 2013-09-25 DIAGNOSIS — O0993 Supervision of high risk pregnancy, unspecified, third trimester: Secondary | ICD-10-CM

## 2013-09-25 DIAGNOSIS — O99892 Other specified diseases and conditions complicating childbirth: Secondary | ICD-10-CM

## 2013-09-25 LAB — TYPE AND SCREEN
ABO/RH(D): A POS
Antibody Screen: NEGATIVE

## 2013-09-25 LAB — CBC
HCT: 33.8 % — ABNORMAL LOW (ref 36.0–46.0)
Hemoglobin: 11.6 g/dL — ABNORMAL LOW (ref 12.0–15.0)
MCH: 27.4 pg (ref 26.0–34.0)
MCHC: 34.3 g/dL (ref 30.0–36.0)
MCV: 79.7 fL (ref 78.0–100.0)
PLATELETS: 153 10*3/uL (ref 150–400)
RBC: 4.24 MIL/uL (ref 3.87–5.11)
RDW: 13.8 % (ref 11.5–15.5)
WBC: 10.1 10*3/uL (ref 4.0–10.5)

## 2013-09-25 LAB — ABO/RH: ABO/RH(D): A POS

## 2013-09-25 LAB — RPR

## 2013-09-25 MED ORDER — PHENYLEPHRINE 40 MCG/ML (10ML) SYRINGE FOR IV PUSH (FOR BLOOD PRESSURE SUPPORT)
80.0000 ug | PREFILLED_SYRINGE | INTRAVENOUS | Status: DC | PRN
  Filled 2013-09-25: qty 2

## 2013-09-25 MED ORDER — TETANUS-DIPHTH-ACELL PERTUSSIS 5-2.5-18.5 LF-MCG/0.5 IM SUSP: 0.5000 mL | Freq: Once | INTRAMUSCULAR | Status: DC

## 2013-09-25 MED ORDER — DIPHENHYDRAMINE HCL 50 MG/ML IJ SOLN: 12.5000 mg | INTRAMUSCULAR | Status: DC | PRN

## 2013-09-25 MED ORDER — ACETAMINOPHEN 325 MG PO TABS: 650.0000 mg | ORAL_TABLET | ORAL | Status: DC | PRN

## 2013-09-25 MED ORDER — ONDANSETRON HCL 4 MG/2ML IJ SOLN: 4.0000 mg | INTRAMUSCULAR | Status: DC | PRN

## 2013-09-25 MED ORDER — SENNOSIDES-DOCUSATE SODIUM 8.6-50 MG PO TABS
2.0000 | ORAL_TABLET | ORAL | Status: DC
  Administered 2013-09-26 (×2): 2 via ORAL
  Filled 2013-09-25 (×2): qty 2

## 2013-09-25 MED ORDER — OXYTOCIN BOLUS FROM INFUSION: 500.0000 mL | INTRAVENOUS | Status: DC

## 2013-09-25 MED ORDER — LACTATED RINGERS IV SOLN: 500.0000 mL | Freq: Once | INTRAVENOUS | Status: DC

## 2013-09-25 MED ORDER — ZOLPIDEM TARTRATE 5 MG PO TABS: 5.0000 mg | ORAL_TABLET | Freq: Every evening | ORAL | Status: DC | PRN

## 2013-09-25 MED ORDER — WITCH HAZEL-GLYCERIN EX PADS: 1.0000 "application " | MEDICATED_PAD | CUTANEOUS | Status: DC | PRN

## 2013-09-25 MED ORDER — CITRIC ACID-SODIUM CITRATE 334-500 MG/5ML PO SOLN: 30.0000 mL | ORAL | Status: DC | PRN

## 2013-09-25 MED ORDER — DIBUCAINE 1 % RE OINT: 1.0000 "application " | TOPICAL_OINTMENT | RECTAL | Status: DC | PRN

## 2013-09-25 MED ORDER — ONDANSETRON HCL 4 MG/2ML IJ SOLN: 4.0000 mg | Freq: Four times a day (QID) | INTRAMUSCULAR | Status: DC | PRN

## 2013-09-25 MED ORDER — EPHEDRINE 5 MG/ML INJ
10.0000 mg | INTRAVENOUS | Status: DC | PRN
Start: 2013-09-25 — End: 2013-09-25
  Filled 2013-09-25: qty 2

## 2013-09-25 MED ORDER — OXYCODONE-ACETAMINOPHEN 5-325 MG PO TABS: 1.0000 | ORAL_TABLET | ORAL | Status: DC | PRN

## 2013-09-25 MED ORDER — ONDANSETRON HCL 4 MG PO TABS: 4.0000 mg | ORAL_TABLET | ORAL | Status: DC | PRN

## 2013-09-25 MED ORDER — PRENATAL MULTIVITAMIN CH
1.0000 | ORAL_TABLET | Freq: Every day | ORAL | Status: DC
  Administered 2013-09-26 – 2013-09-27 (×2): 1 via ORAL
  Filled 2013-09-25 (×3): qty 1

## 2013-09-25 MED ORDER — IBUPROFEN 600 MG PO TABS
600.0000 mg | ORAL_TABLET | Freq: Four times a day (QID) | ORAL | Status: DC
  Administered 2013-09-25 – 2013-09-27 (×7): 600 mg via ORAL
  Filled 2013-09-25 (×9): qty 1

## 2013-09-25 MED ORDER — DIPHENHYDRAMINE HCL 25 MG PO CAPS: 25.0000 mg | ORAL_CAPSULE | Freq: Four times a day (QID) | ORAL | Status: DC | PRN

## 2013-09-25 MED ORDER — FENTANYL 2.5 MCG/ML BUPIVACAINE 1/10 % EPIDURAL INFUSION (WH - ANES): 14.0000 mL/h | INTRAMUSCULAR | Status: DC | PRN

## 2013-09-25 MED ORDER — SODIUM CHLORIDE 0.9 % IV SOLN
2.0000 g | Freq: Once | INTRAVENOUS | Status: AC
  Administered 2013-09-25: 2 g via INTRAVENOUS
  Filled 2013-09-25: qty 2000

## 2013-09-25 MED ORDER — LACTATED RINGERS IV SOLN
INTRAVENOUS | Status: DC
  Administered 2013-09-25: 03:00:00 via INTRAVENOUS

## 2013-09-25 MED ORDER — LIDOCAINE HCL (PF) 1 % IJ SOLN
30.0000 mL | INTRAMUSCULAR | Status: DC | PRN
Start: 2013-09-25 — End: 2013-09-25
  Filled 2013-09-25: qty 30

## 2013-09-25 MED ORDER — FENTANYL CITRATE 0.05 MG/ML IJ SOLN
100.0000 ug | INTRAMUSCULAR | Status: DC | PRN
  Administered 2013-09-25 (×3): 100 ug via INTRAVENOUS
  Filled 2013-09-25 (×3): qty 2

## 2013-09-25 MED ORDER — LANOLIN HYDROUS EX OINT: TOPICAL_OINTMENT | CUTANEOUS | Status: DC | PRN

## 2013-09-25 MED ORDER — OXYTOCIN 40 UNITS IN LACTATED RINGERS INFUSION - SIMPLE MED
62.5000 mL/h | INTRAVENOUS | Status: DC
  Filled 2013-09-25: qty 1000

## 2013-09-25 MED ORDER — SIMETHICONE 80 MG PO CHEW: 80.0000 mg | CHEWABLE_TABLET | ORAL | Status: DC | PRN

## 2013-09-25 MED ORDER — LACTATED RINGERS IV SOLN: 500.0000 mL | INTRAVENOUS | Status: DC | PRN

## 2013-09-25 MED ORDER — IBUPROFEN 600 MG PO TABS
600.0000 mg | ORAL_TABLET | Freq: Four times a day (QID) | ORAL | Status: DC | PRN
  Administered 2013-09-25: 600 mg via ORAL
  Filled 2013-09-25: qty 1

## 2013-09-25 MED ORDER — BENZOCAINE-MENTHOL 20-0.5 % EX AERO
1.0000 "application " | INHALATION_SPRAY | CUTANEOUS | Status: DC | PRN
  Filled 2013-09-25: qty 56

## 2013-09-25 NOTE — Lactation Note (Signed)
This note was copied from the chart of Boy Isa RankinKayquoi Strout. Lactation Consultation Note Mom is Breast/formula/bottle feeding. Didn't BF her other children. Plans to formula bottle feed when she gets her WIC voulchers. Just going to BF some while in hospital. Explained the important benefits of BF and the nutrition in Breast milk verses formula. Baby sleepy, attempted to feed, not interested at this time.  Educated about newborn behavior. Mom encouraged to feed baby 8-12 times/24 hours and with feeding cues. Mom encouraged to waken baby for feeds. Referred to Baby and Me Book in Breastfeeding section Pg. 22-23 for position options and Proper latch demonstration. Hand expression taught to Mom. WH/LC brochure given w/resources, support groups and LC services. Encouraged comfort during BF so colostrum flows better and mom will enjoy the feeding longer. Taking deep breaths and breast massage during BF. Encouraged to call for assistance if needed and to verify proper latch.  Patient Name: Boy Isa RankinKayquoi Lac  [id number]'s Date: 09/25/2013 Reason for consult: Initial assessment   Maternal Data Has patient been taught Hand Expression?: Yes Does the patient have breastfeeding experience prior to this delivery?: No  Feeding Feeding Type: Bottle Fed - Formula Length of feed: 0 min  LATCH Score/Interventions Latch: Too sleepy or reluctant, no latch achieved, no sucking elicited. Intervention(s): Teach feeding cues;Waking techniques;Skin to skin  Audible Swallowing: None Intervention(s): Skin to skin;Hand expression  Type of Nipple: Everted at rest and after stimulation  Comfort (Breast/Nipple): Soft / non-tender     Hold (Positioning): Assistance needed to correctly position infant at breast and maintain latch. Intervention(s): Breastfeeding basics reviewed;Support Pillows;Position options;Skin to skin  LATCH Score: 5  Lactation Tools Discussed/Used     Consult Status Consult Status: Follow-up Date:  09/26/13 Follow-up type: In-patient    Charyl DancerCARVER, Reinhold Rickey G 09/25/2013, 3:27 PM

## 2013-09-25 NOTE — H&P (Signed)
Kelsey RankinKayquoi Hendrie is a 23 y.o. female G3P1011 at 5136w1d gestation presenting for contractions. Maternal Medical History:  Reason for admission: Nausea.     Patient noted contractions that woke her up at midnight and have increased in frequency and severity. No leakage of fluid. No vaginal bleeding or discharge. Positive fetal movement. Previous delivery was vaginal and required episiotomy. Patient is GBS positive. No other prenatal complications. OB History   Grav Para Term Preterm Abortions TAB SAB Ect Mult Living   3 1 1  0 1 1 0 0 0 1     History reviewed. No pertinent past medical history. History reviewed. No pertinent past surgical history. Family History: family history includes Hypertension in her father. Social History:  reports that she has never smoked. She has never used smokeless tobacco. She reports that she does not drink alcohol or use illicit drugs.   Prenatal Transfer Tool  Maternal Diabetes: No Genetic Screening: Normal Maternal Ultrasounds/Referrals: Normal Fetal Ultrasounds or other Referrals:  None Maternal Substance Abuse:  No Significant Maternal Medications:  None Significant Maternal Lab Results:  None Other Comments:  None  Review of Systems  Constitutional: Negative for fever and chills.  HENT: Negative for hearing loss.   Eyes: Negative for blurred vision and double vision.  Respiratory: Negative for cough and shortness of breath.   Cardiovascular: Negative for chest pain and palpitations.  Gastrointestinal: Positive for abdominal pain. Negative for heartburn, nausea, vomiting, diarrhea and constipation.  Genitourinary: Negative for dysuria and urgency.  Musculoskeletal: Negative for myalgias.  Skin: Negative for itching and rash.  Neurological: Negative for dizziness, tingling and headaches.    Dilation: 5.5 Effacement (%): 90 Station: -1 Exam by:: Weston,RN Blood pressure 126/70, pulse 77, temperature 97.2 F (36.2 C), temperature source Oral,  resp. rate 20, height 5\' 2"  (1.575 m), weight 91.627 kg (202 lb), last menstrual period 12/06/2012. Exam Physical Exam  Constitutional: She is oriented to person, place, and time. She appears well-developed and well-nourished.  Appears very uncomfortable  Cardiovascular: Normal rate and regular rhythm.   Respiratory: Effort normal and breath sounds normal.  GI: Soft. Bowel sounds are normal. There is no tenderness.  Musculoskeletal: She exhibits no edema and no tenderness.  Neurological: She is alert and oriented to person, place, and time.  Skin: Skin is warm and dry.    Prenatal labs: ABO, Rh: A/Positive/-- (04/13 0000) Antibody: Negative (04/13 0000) Rubella: Immune (04/13 0000) RPR: Nonreactive (04/13 0000)  HBsAg: Negative (04/13 0000)  HIV: Non-reactive (04/13 0000)  GBS: Positive (07/16 0000)   Assessment/Plan: 23 year old G3P1011 at 1936w1d gestation who presents in active labor.  #Labor- Admit to L&D. Check CBC, RPR. Continuous fetal monitoring. Will AROM after abx. #anesthesia- Patient would like to try IV pain meds. Declining epidural at this time. #GBS positive- Ampicillin #Breast #Undecided contraception  Seen with Francene FindersKimberly Chenise Mulvihill  Stephens, Devin A 09/25/2013, 3:14 AM  I have seen and examined this patient and I agree with the above. FHR +accels, no decels. Reg ctx. Cam HaiSHAW, Cherrie Franca CNM 8:51 AM 09/25/2013

## 2013-09-25 NOTE — MAU Note (Signed)
contractions 

## 2013-09-26 NOTE — H&P (Signed)
Attestation of Attending Supervision of Advanced Practitioner (CNM/NP): Evaluation and management procedures were performed by the Advanced Practitioner under my supervision and collaboration. I have reviewed the Advanced Practitioner's note and chart, and I agree with the management and plan.  Torrin Frein H. 11:13 AM   

## 2013-09-26 NOTE — Progress Notes (Signed)
Post Partum Day 1 NSVD @ [phone] 09/25/13 GBS positive, inadequate prophylaxis prior to delivery  Subjective: no complaints, up ad lib, tolerating PO and + flatus  Objective: Blood pressure 105/67, pulse 84, temperature 98.1 F (36.7 C), temperature source Oral, resp. rate 18, height 5\' 2"  (1.575 m), weight 91.627 kg (202 lb), last menstrual period 12/06/2012, SpO2 97.00%, unknown if currently breastfeeding.  Physical Exam:  General: alert, cooperative and no distress Lochia: appropriate Uterine Fundus: firm Incision: n/a DVT Evaluation: No evidence of DVT seen on physical exam. Negative Homan's sign. No cords or calf tenderness. No significant calf/ankle edema.   Recent Labs  09/25/13 0320  HGB 11.6*  HCT 33.8*    Assessment/Plan: Plan for discharge tomorrow, Breastfeeding and Contraception Mirena   LOS: 1 day   LEFTWICH-KIRBY, [place] Nunley 09/26/2013, 9:06 AM

## 2013-09-27 ENCOUNTER — Encounter: Payer: Medicaid Other | Admitting: Advanced Practice Midwife

## 2013-09-27 NOTE — Discharge Instructions (Signed)

## 2013-09-27 NOTE — Discharge Summary (Signed)
Obstetric Discharge Summary Reason for Admission: onset of labor Prenatal Procedures: none Intrapartum Procedures: spontaneous vaginal delivery Postpartum Procedures: none Complications-Operative and Postpartum: 1st degree perineal laceration  Delivery Note At 6:46 AM a viable female was delivered via Vaginal, Spontaneous Delivery (Presentation: ; Occiput Anterior).  APGAR: 6, 9; weight 7 lb 8.3 oz (3410 g).   Placenta status: , Spontaneous.  Cord:  with the following complications: None.   Anesthesia: None  Episiotomy: None Lacerations: first degree Suture Repair: none Est. Blood Loss (mL): 100  Mom to postpartum.  Baby to Couplet care / Skin to Skin.  Kelsey Castillo 09/27/2013, 7:15 AM     Hospital Course:  Active Problems:   Active labor   Vaginal delivery   Today: No acute events overnight.  Pt denies problems with ambulating, voiding or po intake.  She denies nausea or vomiting.  Pain is well controlled.  She has had flatus. She has not had bowel movement.  Lochia Small.  Plan for birth control is  mirena.  Method of Feeding: breast and bottle  Kelsey Castillo is a 23 y.o. [id number] s/p TSVD.  Patient presented to [facility] with contractions in active labor and was admitted to L&D.  She had a precipitous bed delivery, received inadequate GBS ppx. She has postpartum course that was uncomplicated including no problems with ambulating, PO intake, urination, pain, or bleeding. The pt feels ready to go home and  will be discharged with outpatient follow-up.    H/H: Lab Results  Component Value Date/Time   HGB 11.6* 09/25/2013  3:20 AM   HGB 10.8 07/05/2013   HCT 33.8* 09/25/2013  3:20 AM   HCT 33 07/05/2013    Discharge Diagnoses: Term Pregnancy-delivered  Discharge Information: Date: 09/27/2013 Activity: pelvic rest Diet: routine  Medications: Ibuprofen and Colace Breast feeding:  Yes Condition: stable Instructions: refer to handout Discharge to: home       Discharge  Instructions   Call MD for:  redness, tenderness, or signs of infection (pain, swelling, redness, odor or green/yellow discharge around incision site)    Complete by:  As directed      Call MD for:  redness, tenderness, or signs of infection (pain, swelling, redness, odor or green/yellow discharge around incision site)    Complete by:  As directed      Call MD for:  severe uncontrolled pain    Complete by:  As directed      Call MD for:  severe uncontrolled pain    Complete by:  As directed      Call MD for:  temperature >100.4    Complete by:  As directed      Call MD for:  temperature >100.4    Complete by:  As directed             Medication List         PNV PO  Take by mouth.         Perry MountACOSTA,Dreya Buhrman Castillo ,MD OB Fellow 09/27/2013,8:47 AM

## 2013-09-27 NOTE — Lactation Note (Signed)
This note was copied from the chart of Boy Isa RankinKayquoi Mayse. Lactation Consultation Note  Patient Name: Boy Isa RankinKayquoi Arentz ZOXWR'UToday's Date: 09/27/2013 Reason for consult: Follow-up assessment Mom plans to breast and bottle feed at this time. LC reviewed with Mom importance of BF each feeding before giving any bottles to encourage milk production, prevent engorgement and protect milk supply. Assisted Mom with latching baby in football hold. Baby demonstrated a good rhythmic suck with few swallows noted. Engorgement care reviewed if needed. Mom denied other questions or concerns.  Advised of OP services and support group.   Maternal Data    Feeding Feeding Type: Breast Fed Length of feed: 10 min  LATCH Score/Interventions Latch: Grasps breast easily, tongue down, lips flanged, rhythmical sucking. Intervention(s): Adjust position;Assist with latch;Breast massage;Breast compression  Audible Swallowing: A few with stimulation  Type of Nipple: Everted at rest and after stimulation  Comfort (Breast/Nipple): Soft / non-tender     Hold (Positioning): Assistance needed to correctly position infant at breast and maintain latch. Intervention(s): Breastfeeding basics reviewed;Support Pillows;Position options;Skin to skin  LATCH Score: 8  Lactation Tools Discussed/Used     Consult Status Consult Status: Complete Date: 09/27/13 Follow-up type: In-patient    Alfred LevinsGranger, Kyshaun Barnette Ann 09/27/2013, 10:20 AM

## 2013-10-15 ENCOUNTER — Encounter: Payer: Self-pay | Admitting: Family Medicine

## 2013-11-19 ENCOUNTER — Encounter: Payer: Self-pay | Admitting: Family Medicine

## 2013-11-19 ENCOUNTER — Ambulatory Visit (INDEPENDENT_AMBULATORY_CARE_PROVIDER_SITE_OTHER): Payer: Medicaid Other | Admitting: Family Medicine

## 2013-11-19 NOTE — Progress Notes (Signed)
  Subjective:     Kelsey Castillo is a 23 y.o. female who presents for a postpartum visit. She is 6 weeks postpartum following a spontaneous vaginal delivery. I have fully reviewed the prenatal and intrapartum course. The delivery was at 40 gestational weeks. Outcome: spontaneous vaginal delivery. Anesthesia: IV. Postpartum course has been normal. Baby's course has been normal. Baby is feeding by breast/bottle. Bleeding no bleeding. Bowel function is normal. Bladder function is normal. Patient is not sexually active. Contraception method is abstinence. Postpartum depression screening: negative.  Had carpal tunnel during pregnancy.  Starting to resolve over past 2 weeks, but still having some pain and numbness in her right hand, worse when she wakes up.  The following portions of the patient's history were reviewed and updated as appropriate: allergies, current medications, past family history, past medical history, past social history, past surgical history and problem list.  Review of Systems Pertinent items are noted in HPI.   Objective:    BP 113/65  Pulse 68  Temp(Src) 98.3 F (36.8 C) (Oral)  Ht 5\' 3"  (1.6 m)  Wt 177 lb 4.8 oz (80.423 kg)  BMI 31.42 kg/m2  Breastfeeding? No  General:  alert, cooperative and no distress  Lungs: clear to auscultation bilaterally  Heart:  regular rate and rhythm, S1, S2 normal, no murmur, click, rub or gallop  Abdomen: soft, non-tender; bowel sounds normal; no masses,  no organomegaly        Assessment:     6 week postpartum exam. Pap smear not done at today's visit.   Plan:    1. Contraception: abstinence.  Wants Nexplanon in 6 months 2. Follow up in: 6 months or as needed.

## 2013-11-19 NOTE — Progress Notes (Signed)
Patient reports numbness in right arm and tingling in fingertips; patient states she has not breastfed in a month but would like to start back

## 2013-11-19 NOTE — Patient Instructions (Signed)
Postpartum Depression and Baby Blues The postpartum period begins right after the birth of a baby. During this time, there is often a great amount of joy and excitement. It is also a time of many changes in the life of the parents. Regardless of how many times a mother gives birth, each child brings new challenges and dynamics to the family. It is not unusual to have feelings of excitement along with confusing shifts in moods, emotions, and thoughts. All mothers are at risk of developing postpartum depression or the "baby blues." These mood changes can occur right after giving birth, or they may occur many months after giving birth. The baby blues or postpartum depression can be mild or severe. Additionally, postpartum depression can go away rather quickly, or it can be a long-term condition.  CAUSES Raised hormone levels and the rapid drop in those levels are thought to be a main cause of postpartum depression and the baby blues. A number of hormones change during and after pregnancy. Estrogen and progesterone usually decrease right after the delivery of your baby. The levels of thyroid hormone and various cortisol steroids also rapidly drop. Other factors that play a role in these mood changes include major life events and genetics.  RISK FACTORS If you have any of the following risks for the baby blues or postpartum depression, know what symptoms to watch out for during the postpartum period. Risk factors that may increase the likelihood of getting the baby blues or postpartum depression include:  Having a personal or family history of depression.   Having depression while being pregnant.   Having premenstrual mood issues or mood issues related to oral contraceptives.  Having a lot of life stress.   Having marital conflict.   Lacking a social support network.   Having a baby with special needs.   Having health problems, such as diabetes.  SIGNS AND SYMPTOMS Symptoms of baby blues  include:  Brief changes in mood, such as going from extreme happiness to sadness.  Decreased concentration.   Difficulty sleeping.   Crying spells, tearfulness.   Irritability.   Anxiety.  Symptoms of postpartum depression typically begin within the first month after giving birth. These symptoms include:  Difficulty sleeping or excessive sleepiness.   Marked weight loss.   Agitation.   Feelings of worthlessness.   Lack of interest in activity or food.  Postpartum psychosis is a very serious condition and can be dangerous. Fortunately, it is rare. Displaying any of the following symptoms is cause for immediate medical attention. Symptoms of postpartum psychosis include:   Hallucinations and delusions.   Bizarre or disorganized behavior.   Confusion or disorientation.  DIAGNOSIS  A diagnosis is made by an evaluation of your symptoms. There are no medical or lab tests that lead to a diagnosis, but there are various questionnaires that a health care provider may use to identify those with the baby blues, postpartum depression, or psychosis. Often, a screening tool called the Edinburgh Postnatal Depression Scale is used to diagnose depression in the postpartum period.  TREATMENT The baby blues usually goes away on its own in 1-2 weeks. Social support is often all that is needed. You will be encouraged to get adequate sleep and rest. Occasionally, you may be given medicines to help you sleep.  Postpartum depression requires treatment because it can last several months or longer if it is not treated. Treatment may include individual or group therapy, medicine, or both to address any social, physiological, and psychological   factors that may play a role in the depression. Regular exercise, a healthy diet, rest, and social support may also be strongly recommended.  Postpartum psychosis is more serious and needs treatment right away. Hospitalization is often needed. HOME CARE  INSTRUCTIONS  Get as much rest as you can. Nap when the baby sleeps.   Exercise regularly. Some women find yoga and walking to be beneficial.   Eat a balanced and nourishing diet.   Do little things that you enjoy. Have a cup of tea, take a bubble bath, read your favorite magazine, or listen to your favorite music.  Avoid alcohol.   Ask for help with household chores, cooking, grocery shopping, or running errands as needed. Do not try to do everything.   Talk to people close to you about how you are feeling. Get support from your partner, family members, friends, or other new moms.  Try to stay positive in how you think. Think about the things you are grateful for.   Do not spend a lot of time alone.   Only take over-the-counter or prescription medicine as directed by your health care provider.  Keep all your postpartum appointments.   Let your health care provider know if you have any concerns.  SEEK MEDICAL CARE IF: You are having a reaction to or problems with your medicine. SEEK IMMEDIATE MEDICAL CARE IF:  You have suicidal feelings.   You think you may harm the baby or someone else. MAKE SURE YOU:  Understand these instructions.  Will watch your condition.  Will get help right away if you are not doing well or get worse. Document Released: 11/09/2003 Document Revised: 02/09/2013 Document Reviewed: 11/16/2012 Exit[employer] Patient Information 2015 [employer]. This information is not intended to replace advice given to you by your health care provider. Make sure you discuss any questions you have with your health care provider.  

## 2013-12-20 ENCOUNTER — Encounter: Payer: Self-pay | Admitting: Family Medicine

## 2014-06-02 ENCOUNTER — Ambulatory Visit: Payer: Medicaid Other | Admitting: Obstetrics & Gynecology

## 2014-07-07 ENCOUNTER — Ambulatory Visit: Payer: Medicaid Other | Admitting: Obstetrics & Gynecology

## 2014-08-04 ENCOUNTER — Ambulatory Visit: Payer: Medicaid Other | Admitting: Obstetrics & Gynecology

## 2014-12-01 ENCOUNTER — Emergency Department (HOSPITAL_COMMUNITY)
Admission: EM | Admit: 2014-12-01 | Discharge: 2014-12-01 | Discharge status: Absent without leave | Payer: BLUE CROSS/BLUE SHIELD | Source: Home / Self Care

## 2014-12-08 ENCOUNTER — Encounter (HOSPITAL_COMMUNITY): Payer: Self-pay | Admitting: Emergency Medicine

## 2014-12-08 ENCOUNTER — Inpatient Hospital Stay (HOSPITAL_COMMUNITY): Payer: BLUE CROSS/BLUE SHIELD | Admitting: Anesthesiology

## 2014-12-08 ENCOUNTER — Emergency Department (HOSPITAL_COMMUNITY): Payer: BLUE CROSS/BLUE SHIELD

## 2014-12-08 ENCOUNTER — Ambulatory Visit (HOSPITAL_COMMUNITY)
Admission: EM | Admit: 2014-12-08 | Discharge: 2014-12-08 | Disposition: A | Discharge status: Home / Self Care | Payer: BLUE CROSS/BLUE SHIELD | Attending: Obstetrics & Gynecology | Admitting: Obstetrics & Gynecology

## 2014-12-08 ENCOUNTER — Encounter (HOSPITAL_COMMUNITY)
Admission: EM | Disposition: A | Discharge status: Home / Self Care | Payer: Self-pay | Source: Home / Self Care | Attending: Obstetrics & Gynecology

## 2014-12-08 DIAGNOSIS — O0289 Other abnormal products of conception: Secondary | ICD-10-CM | POA: Diagnosis not present

## 2014-12-08 DIAGNOSIS — N939 Abnormal uterine and vaginal bleeding, unspecified: Secondary | ICD-10-CM

## 2014-12-08 DIAGNOSIS — O020 Blighted ovum and nonhydatidiform mole: Secondary | ICD-10-CM

## 2014-12-08 DIAGNOSIS — Z3A09 9 weeks gestation of pregnancy: Secondary | ICD-10-CM | POA: Diagnosis not present

## 2014-12-08 DIAGNOSIS — O0889 Other complications following an ectopic and molar pregnancy: Secondary | ICD-10-CM | POA: Insufficient documentation

## 2014-12-08 HISTORY — DX: Blighted ovum and nonhydatidiform mole: O02.0

## 2014-12-08 HISTORY — PX: DILATION AND EVACUATION: SHX1459

## 2014-12-08 LAB — URINALYSIS, ROUTINE W REFLEX MICROSCOPIC
Bilirubin Urine: NEGATIVE
GLUCOSE, UA: NEGATIVE mg/dL
HGB URINE DIPSTICK: NEGATIVE
Ketones, ur: NEGATIVE mg/dL
LEUKOCYTES UA: NEGATIVE
Nitrite: NEGATIVE
PH: 8 (ref 5.0–8.0)
PROTEIN: NEGATIVE mg/dL
Specific Gravity, Urine: 1.01 (ref 1.005–1.030)
Urobilinogen, UA: 0.2 mg/dL (ref 0.0–1.0)

## 2014-12-08 LAB — TYPE AND SCREEN
ABO/RH(D): A POS
ANTIBODY SCREEN: NEGATIVE

## 2014-12-08 LAB — CBC
HCT: 32.8 % — ABNORMAL LOW (ref 36.0–46.0)
HCT: 31.6 % — ABNORMAL LOW (ref 36.0–46.0)
Hemoglobin: 10.8 g/dL — ABNORMAL LOW (ref 12.0–15.0)
Hemoglobin: 10.4 g/dL — ABNORMAL LOW (ref 12.0–15.0)
MCH: 26.1 pg (ref 26.0–34.0)
MCH: 26 pg (ref 26.0–34.0)
MCHC: 32.9 g/dL (ref 30.0–36.0)
MCHC: 32.9 g/dL (ref 30.0–36.0)
MCV: 79.2 fL (ref 78.0–100.0)
MCV: 79 fL (ref 78.0–100.0)
PLATELETS: 211 10*3/uL (ref 150–400)
Platelets: 217 10*3/uL (ref 150–400)
RBC: 4.14 MIL/uL (ref 3.87–5.11)
RBC: 4 MIL/uL (ref 3.87–5.11)
RDW: 13.5 % (ref 11.5–15.5)
RDW: 13.6 % (ref 11.5–15.5)
WBC: 5.6 10*3/uL (ref 4.0–10.5)
WBC: 5.9 10*3/uL (ref 4.0–10.5)

## 2014-12-08 LAB — WET PREP, GENITAL
Clue Cells Wet Prep HPF POC: NONE SEEN
Trich, Wet Prep: NONE SEEN
Yeast Wet Prep HPF POC: NONE SEEN

## 2014-12-08 LAB — HCG, QUANTITATIVE, PREGNANCY: HCG, BETA CHAIN, QUANT, S: 490760 m[IU]/mL — AB (ref <5)

## 2014-12-08 LAB — BASIC METABOLIC PANEL
Anion gap: 7 (ref 5–15)
BUN: <5 mg/dL — ABNORMAL LOW (ref 6–20)
CO2: 25 mmol/L (ref 22–32)
Calcium: 9.3 mg/dL (ref 8.9–10.3)
Chloride: 103 mmol/L (ref 101–111)
Creatinine, Ser: 0.64 mg/dL (ref 0.44–1.00)
GFR calc Af Amer: >60 mL/min (ref >60)
GFR calc non Af Amer: >60 mL/min (ref >60)
Glucose, Bld: 84 mg/dL (ref 65–99)
Potassium: 3.9 mmol/L (ref 3.5–5.1)
Sodium: 135 mmol/L (ref 135–145)

## 2014-12-08 LAB — HIV ANTIBODY (ROUTINE TESTING W REFLEX): HIV Screen 4th Generation wRfx: NONREACTIVE

## 2014-12-08 SURGERY — DILATION AND EVACUATION, UTERUS
Anesthesia: Monitor Anesthesia Care | Site: Vagina

## 2014-12-08 MED ORDER — MIDAZOLAM HCL 2 MG/2ML IJ SOLN
INTRAMUSCULAR | Status: DC | PRN
  Administered 2014-12-08: 2 mg via INTRAVENOUS

## 2014-12-08 MED ORDER — KETOROLAC TROMETHAMINE 30 MG/ML IJ SOLN
INTRAMUSCULAR | Status: DC | PRN
  Administered 2014-12-08: 30 mg via INTRAVENOUS

## 2014-12-08 MED ORDER — DEXAMETHASONE SODIUM PHOSPHATE 10 MG/ML IJ SOLN
INTRAMUSCULAR | Status: DC | PRN
  Administered 2014-12-08: 10 mg via INTRAVENOUS

## 2014-12-08 MED ORDER — OXYCODONE-ACETAMINOPHEN 5-325 MG PO TABS: 1.0000 | ORAL_TABLET | Freq: Four times a day (QID) | ORAL | Status: DC | PRN

## 2014-12-08 MED ORDER — DOXYCYCLINE HYCLATE 100 MG IV SOLR
200.0000 mg | Freq: Once | INTRAVENOUS | Status: AC
  Administered 2014-12-08 (×2): 200 mg via INTRAVENOUS
  Filled 2014-12-08: qty 200

## 2014-12-08 MED ORDER — ONDANSETRON HCL 4 MG/2ML IJ SOLN
INTRAMUSCULAR | Status: DC | PRN
  Administered 2014-12-08: 4 mg via INTRAVENOUS

## 2014-12-08 MED ORDER — BUPIVACAINE-EPINEPHRINE 0.5% -1:200000 IJ SOLN
INTRAMUSCULAR | Status: DC | PRN
  Administered 2014-12-08: 10 mL

## 2014-12-08 MED ORDER — PROPOFOL 10 MG/ML IV BOLUS
INTRAVENOUS | Status: DC | PRN
  Administered 2014-12-08 (×4): 20 mg via INTRAVENOUS

## 2014-12-08 MED ORDER — SODIUM CHLORIDE 0.9 % IV SOLN
Freq: Once | INTRAVENOUS | Status: AC
  Administered 2014-12-08: 11:00:00 via INTRAVENOUS

## 2014-12-08 MED ORDER — LACTATED RINGERS IV SOLN
INTRAVENOUS | Status: DC
  Administered 2014-12-08: 14:00:00 via INTRAVENOUS

## 2014-12-08 MED ORDER — OXYTOCIN 10 UNIT/ML IJ SOLN
40.0000 [IU] | INTRAVENOUS | Status: DC | PRN
  Administered 2014-12-08: 20 [IU] via INTRAVENOUS

## 2014-12-08 MED ORDER — FENTANYL CITRATE (PF) 100 MCG/2ML IJ SOLN
INTRAMUSCULAR | Status: DC | PRN
  Administered 2014-12-08 (×2): 100 ug via INTRAVENOUS

## 2014-12-08 SURGICAL SUPPLY — 19 items
CATH ROBINSON RED A/P 16FR (CATHETERS) ×3 IMPLANT
CLOTH BEACON ORANGE TIMEOUT ST (SAFETY) ×3 IMPLANT
DECANTER SPIKE VIAL GLASS SM (MISCELLANEOUS) ×3 IMPLANT
GLOVE BIO SURGEON STRL SZ 6.5 (GLOVE) ×2 IMPLANT
GLOVE BIO SURGEONS STRL SZ 6.5 (GLOVE) ×1
GLOVE BIOGEL PI IND STRL 7.0 (GLOVE) ×1 IMPLANT
GLOVE BIOGEL PI INDICATOR 7.0 (GLOVE) ×2
GOWN STRL REUS W/TWL LRG LVL3 (GOWN DISPOSABLE) ×6 IMPLANT
KIT BERKELEY 1ST TRIMESTER 3/8 (MISCELLANEOUS) ×3 IMPLANT
NS IRRIG 1000ML POUR BTL (IV SOLUTION) ×3 IMPLANT
PACK VAGINAL MINOR WOMEN LF (CUSTOM PROCEDURE TRAY) ×3 IMPLANT
PAD OB MATERNITY 4.3X12.25 (PERSONAL CARE ITEMS) ×3 IMPLANT
PAD PREP 24X48 CUFFED NSTRL (MISCELLANEOUS) ×3 IMPLANT
SET BERKELEY SUCTION TUBING (SUCTIONS) IMPLANT
TOWEL OR 17X24 6PK STRL BLUE (TOWEL DISPOSABLE) ×6 IMPLANT
VACURETTE 10 RIGID CVD (CANNULA) ×3 IMPLANT
VACURETTE 7MM CVD STRL WRAP (CANNULA) IMPLANT
VACURETTE 8 RIGID CVD (CANNULA) IMPLANT
VACURETTE 9 RIGID CVD (CANNULA) IMPLANT

## 2014-12-08 NOTE — Op Note (Signed)
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 [phone] with US findings c/w molar pregnancy at 3455w3d weeks gestation, needing surgical completion.  Risks of surgery were discussed with the patient including but not limited to: bleeding which may require transfusion; infection which may require antibiotics; injury to uterus or surrounding organs; need for additional procedures including laparotomy or laparoscopy; possibility of intrauterine scarring which may impair future fertility; and other postoperative/anesthesia complications. Written informed consent was obtained.    FINDINGS:  A 10 week size uterus, moderate amounts of products of conception, specimen sent to pathology.  ANESTHESIA:    Monitored intravenous sedation, paracervical block. INTRAVENOUS FLUIDS:  1000 ml of LR. Pitocin  ESTIMATED BLOOD LOSS: 100 ml SPECIMENS:  Products of conception sent to pathology COMPLICATIONS:  None immediate.  PROCEDURE DETAILS:  The patient received intravenous Doxycycline while in the preoperative area.  She was then taken to the operating room where monitored intravenous sedation was administered and was found to be adequate.  After an adequate timeout was performed, she was placed in the dorsal lithotomy position and examined; then prepped and draped in the sterile manner.   Her bladder was catheterized for an unmeasured amount of clear, yellow urine. A vaginal speculum was then placed in the patient's vagina and a single tooth tenaculum was applied to the anterior lip of the cervix.  A paracervical block using 30 ml of 0.5% Marcaine was administered. The cervix was gently dilated to accommodate a 10 mm suction curette that was gently advanced to the uterine fundus.  The suction device was then activated and curette slowly rotated  to clear the uterus of products of conception. Complete evacuation was assured. There was minimal bleeding noted and the tenaculum removed with good hemostasis noted.   All instruments were removed from the patient's vagina.  Sponge and instrument counts were correct times two  The patient tolerated the procedure well and was taken to the recovery area awake, and in stable condition.  The patient will be discharged to home as per PACU criteria.  Routine postoperative instructions given.  She was prescribed Percocet, Ibuprofen and Colace.  She will follow up in the clinic on 2 weeks for postoperative evaluation. Pathology will be reviewed to see if molar pregnancy was confirmed   Kelsey CollinsUGONNA  ANYANWU, MD, FACOG Attending Obstetrician & Gynecologist Faculty Practice, [facility] [place] [facility]

## 2014-12-08 NOTE — ED Notes (Signed)
Pt states she is 9 months pregnant and she is been having spot of blood for 2 weeks and the bleeding is getting progressively worse with blood clots. Pt is having 6/10 lower abd cramping and having nausea and vomiting.

## 2014-12-08 NOTE — ED Provider Notes (Signed)
CSN: [id number]     Arrival date & time 12/08/14  [id number] History   First MD Initiated Contact with Patient 12/08/14 0601     Chief Complaint  Patient presents with  . Vaginal Bleeding    9 months pregnant     (Consider location/radiation/quality/duration/timing/severity/associated sxs/prior Treatment) The history is provided by the patient and medical records. No language interpreter was used.     Kelsey RankinKayquoi Castillo is a 24 y.o. female  [id number] with no major medical problems presents to the Emergency Department complaining of gradual, persistent, progressively worsening vaginal spotting onset 2-3 weeks ago, but bleeding increased 1 week ago. Associated symptoms include blood clots 2 days ago.  Pt also c/o lower abd pain described as cramping and rated at a 6/10.  Nothing makes it better and nothing makes it worse.  Pt also reports intermittent nausea and vomiting throughout her pregnancy.  Pt denies fever, chills, headache, neck pain, chest pain, SOB, diarrhea, weakness, dizziness, syncope, dysuria.  LMP: Oct 03, 2014.  [facility] and was referred to an OB/GYN however she missed this appointment yesterday.   History reviewed. No pertinent past medical history. History reviewed. No pertinent past surgical history. Family History  Problem Relation Age of Onset  . Hypertension Father    Social History  Substance Use Topics  . Smoking status: Never Smoker   . Smokeless tobacco: Never Used  . Alcohol Use: No   OB History    Gravida Para Term Preterm AB TAB SAB Ectopic Multiple Living   3 2 2  0 1 1 0 0 0 2     Review of Systems  Constitutional: Negative for fever, diaphoresis, appetite change, fatigue and unexpected weight change.  HENT: Negative for mouth sores.   Eyes: Negative for visual disturbance.  Respiratory: Negative for cough, chest tightness, shortness of breath and wheezing.   Cardiovascular: Negative for chest pain.  Gastrointestinal:  Positive for nausea, vomiting and abdominal pain. Negative for diarrhea and constipation.  Endocrine: Negative for polydipsia, polyphagia and polyuria.  Genitourinary: Positive for vaginal bleeding. Negative for dysuria, urgency, frequency and hematuria.  Musculoskeletal: Negative for back pain and neck stiffness.  Skin: Negative for rash.  Allergic/Immunologic: Negative for immunocompromised state.  Neurological: Negative for syncope, light-headedness and headaches.  Hematological: Does not bruise/bleed easily.  Psychiatric/Behavioral: Negative for sleep disturbance. The patient is not nervous/anxious.       Allergies  Review of patient's allergies indicates no known allergies.  Home Medications   Prior to Admission medications   Medication Sig Start Date End Date Taking? Authorizing Provider  metroNIDAZOLE (FLAGYL) 500 MG tablet Take 500 mg by mouth 2 (two) times daily. 12/06/14 12/13/14 Yes Historical Provider, MD  ondansetron (ZOFRAN) 4 MG tablet Take 4 mg by mouth every 8 (eight) hours as needed. 12/06/14 12/13/14 Yes Historical Provider, MD   BP 105/62 mmHg  Pulse 64  Temp(Src) 98.4 F (36.9 C) (Oral)  Resp 20  Ht 5\' 3"  (1.6 m)  Wt 175 lb (79.379 kg)  BMI 31.01 kg/m2  SpO2 98%  LMP 10/03/2014 Physical Exam  Constitutional: She appears well-developed and well-nourished. No distress.  Awake, alert, nontoxic appearance  HENT:  Head: Normocephalic and atraumatic.  Mouth/Throat: Oropharynx is clear and moist. No oropharyngeal exudate.  Eyes: Conjunctivae are normal. No scleral icterus.  Neck: Normal range of motion. Neck supple.  Cardiovascular: Normal rate, regular rhythm, normal heart sounds and intact distal pulses.   No murmur heard.  Pulmonary/Chest: Effort normal and breath sounds normal. No respiratory distress. She has no wheezes.  Equal chest expansion  Abdominal: Soft. Bowel sounds are normal. She exhibits no mass. There is no tenderness. There is no rebound and  no guarding. Hernia confirmed negative in the right inguinal area and confirmed negative in the left inguinal area.  Genitourinary: Uterus normal. No labial fusion. There is no rash, tenderness or lesion on the right labia. There is no rash, tenderness or lesion on the left labia. Uterus is not deviated, not enlarged, not fixed and not tender. Cervix exhibits no motion tenderness, no discharge and no friability. Right adnexum displays tenderness. Right adnexum displays no mass and no fullness. Left adnexum displays no mass, no tenderness and no fullness. There is bleeding in the vagina. No erythema or tenderness in the vagina. No foreign body around the vagina. No signs of injury around the vagina. No vaginal discharge found.  Small amount of Clayton blood in the vaginal vault Mild right-sided adnexal tenderness No cervical motion tenderness Cervical os closed Palpable, nontender gravid uterine fundus  Musculoskeletal: Normal range of motion. She exhibits no edema.  Lymphadenopathy:       Right: No inguinal adenopathy present.       Left: No inguinal adenopathy present.  Neurological: She is alert.  Speech is clear and goal oriented Moves extremities without ataxia  Skin: Skin is warm and dry. She is not diaphoretic. No erythema.  Psychiatric: She has a normal mood and affect.  Nursing note and vitals reviewed.   ED Course  Procedures (including critical care time) Labs Review Labs Reviewed  WET PREP, GENITAL - Abnormal; Notable for the following:    WBC, Wet Prep HPF POC FEW (*)    All other components within normal limits  CBC - Abnormal; Notable for the following:    Hemoglobin 10.8 (*)    HCT 32.8 (*)    All other components within normal limits  BASIC METABOLIC PANEL - Abnormal; Notable for the following:    BUN <5 (*)    All other components within normal limits  HCG, QUANTITATIVE, PREGNANCY - Abnormal; Notable for the following:    hCG, Beta Chain, Mahalia Longest [id number] (*)    All  other components within normal limits  URINALYSIS, ROUTINE W REFLEX MICROSCOPIC (NOT AT [facility])  RPR  HIV ANTIBODY (ROUTINE TESTING)  ABO/RH  GC/CHLAMYDIA PROBE AMP ([place]) NOT AT [facility]    Imaging Review US Ob Comp Less 14 Wks  12/08/2014  CLINICAL DATA:  Vaginal bleeding for 2 weeks. Positive pregnancy test. Gestational age by LMP of 9 weeks 3 days. EXAM: OBSTETRIC <14 WK [place] AND TRANSVAGINAL OB US TECHNIQUE: Both transabdominal and transvaginal ultrasound examinations were performed for complete evaluation of the gestation as well as the maternal uterus, adnexal regions, and pelvic cul-de-sac. Transvaginal technique was performed to assess early pregnancy. COMPARISON:  None. FINDINGS: Intrauterine gestational sac: Irregular fluid collection in the endometrium is suspicious for an abnormal intrauterine gestational sac. Yolk sac:  Question tiny yolk sac Embryo:  None visualized MSD: 51  mm   10 w   6  d Maternal uterus/adnexae: Abnormal masslike soft tissue density with numerous tiny cystic foci seen in endometrial cavity adjacent to irregular sac-like structure. This finding in combination with a probable abnormal intrauterine gestational sac is suspicious for a partial molar pregnancy. Both ovaries are normal in appearance. No adnexal mass or free fluid identified. IMPRESSION: Probable abnormal intrauterine gestational sac with adjacent masslike  soft tissue density showing numerous tiny cystic foci. These findings are suspicious for partial molar pregnancy. Recommend correlation with quantitative beta HCG level. Electronically Signed   By: Myles Rosenthal M.D.   On: 12/08/2014 10:07   I have personally reviewed and evaluated these images and lab results as part of my medical decision-making.   EKG Interpretation None      MDM   Final diagnoses:  Molar pregnancy  Vaginal bleeding   Kelsey Castillo presents with lower abdominal cramping, nausea, vomiting and vaginal bleeding. Patient reports  she is [redacted] weeks pregnant.  No evidence of urinary tract infection, labs reassuring though hCG is significantly higher than what would be expected.  Uterine size is also larger than suggested 9 weeks.Marland Kitchen  Ultrasound pending.   10:32 AM [place] with molar pregnancy.  This is consistent with her elevated hCG, enlarged uterus and reports of persistent nausea and vomiting. Discussed with Dr. Alen Bleacher of OB/GYN who will plan for [facility] at 3 PM today. Patient is to be nothing by mouth and will have fluids. [facility]  No tachycardia or hypertension.  BP 105/62 mmHg  Pulse 64  Temp(Src) 98.4 F (36.9 C) (Oral)  Resp 20  Ht  (1.6 m)  Wt 175 lb (79.379 kg)  BMI 31.01 kg/m2  SpO2 98%  LMP 10/03/2014  The patient was discussed with and seen by Dr. Corlis Leak who agrees with the treatment plan.   Dierdre Forth, PA-C 12/08/14 1037  Laurence Spates, MD 12/10/14 2150

## 2014-12-08 NOTE — MAU Note (Signed)
[facility] partial [facility].  Scheduled for D/C today.  Pt reports vaginal bleeding x 2 weeks.

## 2014-12-08 NOTE — H&P (Signed)
Kelsey Castillo is an 24 y.o. female who was transferred from [facility] for a molar pregnancy on [place].  Pt is having bleeding and cramping but not heavy currently.     Menstrual History: Patient's last menstrual period was 10/03/2014.    Past Medical History  Diagnosis Date  . Molar pregnancy     History reviewed. No pertinent past surgical history.  Family History  Problem Relation Age of Onset  . Hypertension Father     Social History:  reports that she has never smoked. She has never used smokeless tobacco. She reports that she does not drink alcohol or use illicit drugs.  Allergies: No Known Allergies  Prescriptions prior to admission  Medication Sig Dispense Refill Last Dose  . metroNIDAZOLE (FLAGYL) 500 MG tablet Take 500 mg by mouth 2 (two) times daily.   12/07/2014 at Unknown time  . ondansetron (ZOFRAN) 4 MG tablet Take 4 mg by mouth every 8 (eight) hours as needed.   12/07/2014 at Unknown time    Review of Systems  Constitutional: Negative.   Respiratory: Negative.   Cardiovascular: Negative.   Gastrointestinal: Negative.   Genitourinary:       + vaginal bleeding--light today but has had clots  Skin: Negative.   Psychiatric/Behavioral: Negative.     Blood pressure 115/65, pulse 67, temperature 97.9 F (36.6 C), temperature source Oral, resp. rate 18, height  (1.6 m), weight 175 lb (79.379 kg), last menstrual period 10/03/2014, SpO2 98 %, not currently breastfeeding. Physical Exam  Vitals reviewed. Constitutional: She is oriented to person, place, and time. She appears well-developed and well-nourished. No distress.  HENT:  Head: Normocephalic and atraumatic.  Neck: Neck supple. No thyromegaly present.  Cardiovascular: Normal rate.   Respiratory: Effort normal and breath sounds normal.  GI: Soft.  Genitourinary:  Exam to be repeated in the OR  Musculoskeletal: She exhibits no edema.  Neurological: She is alert and oriented to person, place, and time.  Skin:  Skin is warm and dry.  Psychiatric: She has a normal mood and affect.    Results for orders placed or performed during the hospital encounter of 12/08/14 (from the past 24 hour(s))  CBC     Status: Abnormal   Collection Time: 12/08/14  6:16 AM  Result Value Ref Range   WBC 5.6 4.0 - 10.5 K/uL   RBC 4.14 3.87 - 5.11 MIL/uL   Hemoglobin 10.8 (L) 12.0 - 15.0 g/dL   HCT 84.6 (L) 96.2 - 95.2 %   MCV 79.2 78.0 - 100.0 fL   MCH 26.1 26.0 - 34.0 pg   MCHC 32.9 30.0 - 36.0 g/dL   RDW 84.1 32.4 - 40.1 %   Platelets 217 150 - 400 K/uL  Basic metabolic panel     Status: Abnormal   Collection Time: 12/08/14  6:16 AM  Result Value Ref Range   Sodium 135 135 - 145 mmol/L   Potassium 3.9 3.5 - 5.1 mmol/L   Chloride 103 101 - 111 mmol/L   CO2 25 22 - 32 mmol/L   Glucose, Bld 84 65 - 99 mg/dL   BUN <5 (L) 6 - 20 mg/dL   Creatinine, Ser 0.27 0.44 - 1.00 mg/dL   Calcium 9.3 8.9 - 25.3 mg/dL   GFR calc non Af Amer >60 >60 mL/min   GFR calc Af Amer >60 >60 mL/min   Anion gap 7 5 - 15  hCG, quantitative, pregnancy     Status: Abnormal   Collection Time: 12/08/14  6:16 AM  Result Value Ref Range   hCG, Beta Chain, Mahalia LongestQuant, S [id number] (H) <5 mIU/mL  Urinalysis, Routine w reflex microscopic (not at [facility])     Status: None   Collection Time: 12/08/14  7:00 AM  Result Value Ref Range   Color, Urine YELLOW YELLOW   APPearance CLEAR CLEAR   Specific Gravity, Urine 1.010 1.005 - 1.030   pH 8.0 5.0 - 8.0   Glucose, UA NEGATIVE NEGATIVE mg/dL   Hgb urine dipstick NEGATIVE NEGATIVE   Bilirubin Urine NEGATIVE NEGATIVE   Ketones, ur NEGATIVE NEGATIVE mg/dL   Protein, ur NEGATIVE NEGATIVE mg/dL   Urobilinogen, UA 0.2 0.0 - 1.0 mg/dL   Nitrite NEGATIVE NEGATIVE   Leukocytes, UA NEGATIVE NEGATIVE  Wet prep, genital     Status: Abnormal   Collection Time: 12/08/14  7:39 AM  Result Value Ref Range   Yeast Wet Prep HPF POC NONE SEEN NONE SEEN   Trich, Wet Prep NONE SEEN NONE SEEN   Clue Cells Wet Prep HPF  POC NONE SEEN NONE SEEN   WBC, Wet Prep HPF POC FEW (A) NONE SEEN    [place] Ob Comp Less 14 Wks  12/08/2014  CLINICAL DATA:  Vaginal bleeding for 2 weeks. Positive pregnancy test. Gestational age by LMP of 9 weeks 3 days. EXAM: OBSTETRIC <14 WK US AND TRANSVAGINAL OB US TECHNIQUE: Both transabdominal and transvaginal ultrasound examinations were performed for complete evaluation of the gestation as well as the maternal uterus, adnexal regions, and pelvic cul-de-sac. Transvaginal technique was performed to assess early pregnancy. COMPARISON:  None. FINDINGS: Intrauterine gestational sac: Irregular fluid collection in the endometrium is suspicious for an abnormal intrauterine gestational sac. Yolk sac:  Question tiny yolk sac Embryo:  None visualized MSD: 51  mm   10 w   6  d Maternal uterus/adnexae: Abnormal masslike soft tissue density with numerous tiny cystic foci seen in endometrial cavity adjacent to irregular sac-like structure. This finding in combination with a probable abnormal intrauterine gestational sac is suspicious for a partial molar pregnancy. Both ovaries are normal in appearance. No adnexal mass or free fluid identified. IMPRESSION: Probable abnormal intrauterine gestational sac with adjacent masslike soft tissue density showing numerous tiny cystic foci. These findings are suspicious for partial molar pregnancy. Recommend correlation with quantitative beta HCG level. Electronically Signed   By: Myles RosenthalJohn  Stahl M.D.   On: 12/08/2014 10:07    Assessment/Plan: D&E  Patient was counseled regarding need for  D&E.  Risks of surgery including bleeding, infection, injury to surrounding organs, need for additional procedures, possibility of intrauterine scarring which may impair future fertility, risk of retained products which may require further management and other postoperative/anesthesia complications were explained to patient.  Likelihood of success of complete evacuation of the uterus was  discussed with the patient.  Written informed consent was obtained. Anesthesia and OR aware.  Preoperative prophylactic Doxycycline 200mg  IV  has been ordered and is on call to the OR.  To OR when ready.     Rhylei Mcquaig H. 12/08/2014, 1:14 PM

## 2014-12-08 NOTE — Discharge Instructions (Signed)
Dilation and Curettage or Vacuum Curettage, Care After Refer to this sheet in the next few weeks. These instructions provide you with information on caring for yourself after your procedure. Your health care provider may also give you more specific instructions. Your treatment has been planned according to current medical practices, but problems sometimes occur. Call your health care provider if you have any problems or questions after your procedure. WHAT TO EXPECT AFTER THE PROCEDURE After your procedure, it is typical to have light cramping and bleeding. This may last for 2 days to 2 weeks after the procedure. HOME CARE INSTRUCTIONS   Do not drive for 24 hours.  Wait 1 week before returning to strenuous activities.  Take your temperature 2 times a day for 4 days and write it down. Provide these temperatures to your health care provider if you develop a fever.  Avoid long periods of standing.  Avoid heavy lifting, pushing, or pulling. Do not lift anything heavier than 10 pounds (4.5 kg).  Limit stair climbing to once or twice a day.  Take rest periods often.  You may resume your usual diet.  Drink enough fluids to keep your urine clear or pale yellow.  Your usual bowel function should return. If you have constipation, you may:  Take a mild laxative with permission from your health care provider.  Add fruit and bran to your diet.  Drink more fluids.  Take showers instead of baths until your health care provider gives you permission to take baths.  Do not go swimming or use a hot tub until your health care provider approves.  Try to have someone with you or available to you the first 24-48 hours, especially if you were given a general anesthetic.  Do not douche, use tampons, or have sex (intercourse) for 2 weeks after the procedure.  Only take over-the-counter or prescription medicines as directed by your health care provider. Do not take aspirin. It can cause  bleeding.  Follow up with your health care provider as directed. SEEK MEDICAL CARE IF:   You have increasing cramps or pain that is not relieved with medicine.  You have abdominal pain that does not seem to be related to the same area of earlier cramping and pain.  You have bad smelling vaginal discharge.  You have a rash.  You are having problems with any medicine. SEEK IMMEDIATE MEDICAL CARE IF:   You have bleeding that is heavier than a normal menstrual period.  You have a fever.  You have chest pain.  You have shortness of breath.  You feel dizzy or feel like fainting.  You pass out.  You have pain in your shoulder strap area.  You have heavy vaginal bleeding with or without blood clots. MAKE SURE YOU:   Understand these instructions.  Will watch your condition.  Will get help right away if you are not doing well or get worse.   This information is not intended to replace advice given to you by your health care provider. Make sure you discuss any questions you have with your health care provider.   Document Released: 02/02/2000 Document Revised: 02/09/2013 Document Reviewed: 09/03/2012 Elsevier Interactive Patient Education 2016 [employer]  

## 2014-12-08 NOTE — Anesthesia Preprocedure Evaluation (Addendum)
Anesthesia Evaluation  Patient identified by MRN, date of birth, ID band Patient awake    Reviewed: Allergy & Precautions, H&P , NPO status , Patient's Chart, lab work & pertinent test results  Airway Mallampati: II  TM Distance: >3 FB Neck ROM: full    Dental no notable dental hx. (+) Teeth Intact   Pulmonary neg pulmonary ROS,    Pulmonary exam normal        Cardiovascular negative cardio ROS Normal cardiovascular exam     Neuro/Psych negative neurological ROS  negative psych ROS   GI/Hepatic negative GI ROS, Neg liver ROS,   Endo/Other  negative endocrine ROS  Renal/GU negative Renal ROS     Musculoskeletal   Abdominal Normal abdominal exam  (+)   Peds  Hematology negative hematology ROS (+)   Anesthesia Other Findings   Reproductive/Obstetrics negative OB ROS                            Anesthesia Physical Anesthesia Plan  ASA: II  Anesthesia Plan: MAC   Post-op Pain Management:    Induction:   Airway Management Planned:   Additional Equipment:   Intra-op Plan:   Post-operative Plan:   Informed Consent: I have reviewed the patients History and Physical, chart, labs and discussed the procedure including the risks, benefits and alternatives for the proposed anesthesia with the patient or authorized representative who has indicated his/her understanding and acceptance.     Plan Discussed with: CRNA and Surgeon  Anesthesia Plan Comments:         Anesthesia Quick Evaluation

## 2014-12-08 NOTE — Anesthesia Postprocedure Evaluation (Signed)
Anesthesia Post Note  Patient: Kelsey Castillo  Procedure(s) Performed: Procedure(s) (LRB): DILATATION AND EVACUATION (N/A)  Anesthesia type: MAC  Patient location: PACU  Post pain: Pain level controlled  Post assessment: Post-op Vital signs reviewed  Last Vitals:  Filed Vitals:   12/08/14 1700  BP:   Pulse: 65  Temp:   Resp: 16    Post vital signs: Reviewed  Level of consciousness: sedated  Complications: No apparent anesthesia complications

## 2014-12-08 NOTE — Transfer of Care (Signed)
Immediate Anesthesia Transfer of Care Note  Patient: Kelsey Castillo  Procedure(s) Performed: Procedure(s): DILATATION AND EVACUATION (N/A)  Patient Location: PACU  Anesthesia Type:MAC  Level of Consciousness: sedated  Airway & Oxygen Therapy: Patient Spontanous Breathing  Post-op Assessment: Report given to RN  Post vital signs: Reviewed and stable  Last Vitals:  Filed Vitals:   12/08/14 1526  BP: 113/66  Pulse: 70  Temp: 36.8 C  Resp: 18    Complications: No apparent anesthesia complications

## 2014-12-09 LAB — GC/CHLAMYDIA PROBE AMP (~~LOC~~) NOT AT ARMC
Chlamydia: NEGATIVE
Neisseria Gonorrhea: NEGATIVE

## 2014-12-09 LAB — RPR: RPR: NONREACTIVE

## 2014-12-09 MED ORDER — LIDOCAINE HCL (CARDIAC) 20 MG/ML IV SOLN
INTRAVENOUS | Status: DC | PRN
  Administered 2014-12-08: 100 mg via INTRAVENOUS

## 2014-12-09 NOTE — Addendum Note (Signed)
Addendum  created 12/09/14 1343 by Algis GreenhouseLinda A Angelle Isais, CRNA   Modules edited: Anesthesia Medication Administration

## 2014-12-11 ENCOUNTER — Encounter (HOSPITAL_COMMUNITY): Payer: Self-pay | Admitting: Obstetrics & Gynecology

## 2014-12-12 NOTE — Addendum Note (Signed)
Addendum  created 12/12/14 1324 by Randa SpikeMyra D Dacia Capers, CRNA   Modules edited: Charges VN

## 2014-12-14 ENCOUNTER — Telehealth: Payer: Self-pay | Admitting: *Deleted

## 2014-12-14 NOTE — Telephone Encounter (Signed)
Per Dr. Debroah LoopArnold patient needs post op follow up and bhcg. Per chart review has appointment [facility]/14[facility] and stressed importance of keeping appointment.  She voices understanding.

## 2014-12-15 ENCOUNTER — Other Ambulatory Visit: Payer: Self-pay

## 2014-12-15 DIAGNOSIS — O020 Blighted ovum and nonhydatidiform mole: Secondary | ICD-10-CM

## 2014-12-16 LAB — HCG, QUANTITATIVE, PREGNANCY: hCG, Beta Chain, Quant, S: 4447.5 m[IU]/mL — ABNORMAL HIGH

## 2014-12-26 ENCOUNTER — Ambulatory Visit (INDEPENDENT_AMBULATORY_CARE_PROVIDER_SITE_OTHER): Payer: BLUE CROSS/BLUE SHIELD | Admitting: Obstetrics & Gynecology

## 2014-12-26 ENCOUNTER — Encounter: Payer: Self-pay | Admitting: Obstetrics & Gynecology

## 2014-12-26 VITALS — BP 111/68 | HR 77 | Temp 99.8°F | Ht 63.0 in | Wt 167.8 lb

## 2014-12-26 DIAGNOSIS — Z30017 Encounter for initial prescription of implantable subdermal contraceptive: Secondary | ICD-10-CM | POA: Diagnosis not present

## 2014-12-26 DIAGNOSIS — O0889 Other complications following an ectopic and molar pregnancy: Secondary | ICD-10-CM

## 2014-12-26 MED ORDER — ETONOGESTREL 68 MG ~~LOC~~ IMPL
68.0000 mg | DRUG_IMPLANT | Freq: Once | SUBCUTANEOUS | Status: AC
  Administered 2014-12-26: 68 mg via SUBCUTANEOUS

## 2014-12-26 NOTE — Progress Notes (Signed)
Subjective:spotting only, request Nexplanon     Kelsey Castillo is a 24 y.o. 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 Eating a regular diet without difficulty. Bowel movements are normal. The patient is not having any pain.  The following portions of the patient's history were reviewed and updated as appropriate: allergies, current medications, past family history, past medical history, past social history, past surgical history and problem list.  Review of Systems Pertinent items are noted in HPI.    Objective:    BP 111/68 mmHg  Pulse 77  Temp(Src) 99.8 F (37.7 C)  Ht 5\' 3"  (1.6 m)  Wt 167 lb 12.8 oz (76.114 kg)  BMI 29.73 kg/m2  LMP 10/03/2014  Breastfeeding? Unknown General:  alert, cooperative and no distress  Abdomen: Not distended  Patient given informed consent, signed copy in the chart, time out was performed. Pregnancy test was not done as she is postop from D&E and denies intercourse since procedure. Appropriate time out taken.  Patient's left arm was prepped and draped in the usual sterile fashion.. The ruler used to measure and mark insertion area.  Pt was prepped with alcohol swab and then injected with 3 cc of 1 % lidocaine.  Pt was prepped with betadine, Nexplanon removed form packaging. Then inserted per standard guidelines. Patient and provider were able to palpate rod under skin. Pt insertion site covered with sterile dressing.   Minimal blood loss.  Pt tolerated the procedure well.  Assessment:    Doing well postoperatively. Operative findings again reviewed. Pathology report discussed.    Plan:    1. Continue any current medications. 2. Wound care discussed. 3. Activity restrictions: none 4. Anticipated return to work: now. 5. Follow up: 2 weeks for HCG.     Adam PhenixJames G Arnold, MD 12/26/2014

## 2014-12-26 NOTE — Progress Notes (Signed)
Since she had D&E on 12/08/14 has had bleeding off and on , sometimes spotting. For the last 7 days either no bleeding or spotting.   Per Dr. Debroah LoopArnold will place nexplanon today, no upt needed due to recent molar preganancy.

## 2014-12-27 LAB — HCG, QUANTITATIVE, PREGNANCY: HCG, BETA CHAIN, QUANT, S: 237.2 m[IU]/mL — AB

## 2015-01-03 ENCOUNTER — Telehealth: Payer: Self-pay | Admitting: General Practice

## 2015-01-03 NOTE — Telephone Encounter (Signed)
Patient needs repeat bhcg in 2 weeks. Called patient, no answer- left message stating to call us back at the clinics in regards to results and setting up an appt.

## 2015-01-04 NOTE — Telephone Encounter (Signed)
Patient called into front office and was informed for need for f/u. Patient has appt on 11/21 for bhcg

## 2015-01-09 ENCOUNTER — Other Ambulatory Visit: Payer: Self-pay

## 2015-01-09 DIAGNOSIS — O0889 Other complications following an ectopic and molar pregnancy: Secondary | ICD-10-CM

## 2015-01-10 ENCOUNTER — Telehealth: Payer: Self-pay | Admitting: *Deleted

## 2015-01-10 LAB — HCG, QUANTITATIVE, PREGNANCY: HCG, BETA CHAIN, QUANT, S: 26.1 m[IU]/mL — AB

## 2015-01-10 NOTE — Telephone Encounter (Signed)
Per Dr. Adrian BlackwaterStinson, pt needs to be called with BHCG results and informed to return in 1 week for repeat BHCG.  I called pt and informed her of the previous stated information. Pt does not yet know her work schedule for next week. She voiced understanding and will call on Monday 11/28 to schedule lab only appt for 1 Kelsey Castillo next week.

## 2015-01-23 ENCOUNTER — Other Ambulatory Visit: Payer: Self-pay

## 2015-01-23 DIAGNOSIS — O0889 Other complications following an ectopic and molar pregnancy: Secondary | ICD-10-CM

## 2015-01-24 LAB — HCG, QUANTITATIVE, PREGNANCY: HCG, BETA CHAIN, QUANT, S: 4.2 m[IU]/mL

## 2015-02-07 ENCOUNTER — Telehealth: Payer: Self-pay | Admitting: *Deleted

## 2015-02-07 NOTE — Telephone Encounter (Signed)
Per message from Dr. Debroah LoopArnold requested Zenovia JarredKayquoi come in for a repeat bhcg on 02/07/15 .  Called her phone number and heard a message this number is not inservice.  Called her contact number and left a message we are trying to [facility]- please have her call the clinic.

## 2015-02-08 ENCOUNTER — Encounter: Payer: Self-pay | Admitting: General Practice

## 2015-02-08 NOTE — Telephone Encounter (Signed)
Called patient at contact number, no answer- left message for patient to call us back about an appt. Will send letter

## 2015-03-20 ENCOUNTER — Other Ambulatory Visit: Payer: Self-pay

## 2015-03-20 DIAGNOSIS — O3680X0 Pregnancy with inconclusive fetal viability, not applicable or unspecified: Secondary | ICD-10-CM

## 2015-03-21 LAB — HCG, QUANTITATIVE, PREGNANCY: hCG, Beta Chain, Quant, S: <2 m[IU]/mL

## 2015-03-22 ENCOUNTER — Telehealth: Payer: Self-pay | Admitting: *Deleted

## 2015-03-22 NOTE — Telephone Encounter (Signed)
Per Dr Adrian Blackwater, called patient to give her bhcg results now >2. Patient needs to return monthly for six months to repeat bhcg. Patient voiced understanding, I will inbox front office for them to call her to schedule next lab visit.

## 2015-05-03 ENCOUNTER — Encounter (HOSPITAL_COMMUNITY): Payer: Self-pay | Admitting: *Deleted

## 2015-05-03 ENCOUNTER — Emergency Department (HOSPITAL_COMMUNITY)
Admission: EM | Admit: 2015-05-03 | Discharge: 2015-05-03 | Disposition: A | Discharge status: Home / Self Care | Payer: BLUE CROSS/BLUE SHIELD | Attending: Emergency Medicine | Admitting: Emergency Medicine

## 2015-05-03 DIAGNOSIS — R1084 Generalized abdominal pain: Secondary | ICD-10-CM

## 2015-05-03 DIAGNOSIS — Z3202 Encounter for pregnancy test, result negative: Secondary | ICD-10-CM | POA: Diagnosis not present

## 2015-05-03 LAB — COMPREHENSIVE METABOLIC PANEL
ALBUMIN: 3.9 g/dL (ref 3.5–5.0)
ALT: 14 U/L (ref 14–54)
AST: 20 U/L (ref 15–41)
Alkaline Phosphatase: 41 U/L (ref 38–126)
Anion gap: 8 (ref 5–15)
BUN: 6 mg/dL (ref 6–20)
CHLORIDE: 106 mmol/L (ref 101–111)
CO2: 27 mmol/L (ref 22–32)
CREATININE: 0.69 mg/dL (ref 0.44–1.00)
Calcium: 9.8 mg/dL (ref 8.9–10.3)
GFR calc non Af Amer: >60 mL/min (ref >60)
GLUCOSE: 107 mg/dL — AB (ref 65–99)
Potassium: 3.9 mmol/L (ref 3.5–5.1)
SODIUM: 141 mmol/L (ref 135–145)
Total Bilirubin: 0.4 mg/dL (ref 0.3–1.2)
Total Protein: 7.8 g/dL (ref 6.5–8.1)

## 2015-05-03 LAB — CBC
HCT: 37.8 % (ref 36.0–46.0)
Hemoglobin: 12.2 g/dL (ref 12.0–15.0)
MCH: 25.5 pg — AB (ref 26.0–34.0)
MCHC: 32.3 g/dL (ref 30.0–36.0)
MCV: 79.1 fL (ref 78.0–100.0)
PLATELETS: 280 10*3/uL (ref 150–400)
RBC: 4.78 MIL/uL (ref 3.87–5.11)
RDW: 14.2 % (ref 11.5–15.5)
WBC: 6.9 10*3/uL (ref 4.0–10.5)

## 2015-05-03 LAB — I-STAT BETA HCG BLOOD, ED (MC, WL, AP ONLY): I-stat hCG, quantitative: <5 m[IU]/mL (ref <5)

## 2015-05-03 LAB — LIPASE, BLOOD: LIPASE: 51 U/L (ref 11–51)

## 2015-05-03 LAB — URINALYSIS, ROUTINE W REFLEX MICROSCOPIC
Bilirubin Urine: NEGATIVE
GLUCOSE, UA: NEGATIVE mg/dL
HGB URINE DIPSTICK: NEGATIVE
Ketones, ur: NEGATIVE mg/dL
Leukocytes, UA: NEGATIVE
Nitrite: NEGATIVE
PROTEIN: NEGATIVE mg/dL
Specific Gravity, Urine: 1.016 (ref 1.005–1.030)
pH: 6.5 (ref 5.0–8.0)

## 2015-05-03 MED ORDER — DICYCLOMINE HCL 10 MG PO CAPS
10.0000 mg | ORAL_CAPSULE | Freq: Once | ORAL | Status: AC
  Administered 2015-05-03: 10 mg via ORAL
  Filled 2015-05-03: qty 1

## 2015-05-03 MED ORDER — DICYCLOMINE HCL 20 MG PO TABS: 20.0000 mg | ORAL_TABLET | Freq: Two times a day (BID) | ORAL | Status: AC

## 2015-05-03 NOTE — ED Notes (Signed)
Pt reports abd pain since Sunday. initially had n/v/d but has resolved since Tuesday. Now reports increase in abd pain and bloating.

## 2015-05-03 NOTE — Discharge Instructions (Signed)
There does not appear to be an emergent cause for your symptoms at this time. Your exam and labs were very reassuring. Take your medication as discussed. Be sure to stay well hydrated and drink plenty of water or Gatorade. Follow-up with your doctor as needed. Return to ED for new or worsening symptoms as we discussed  Abdominal Pain, Adult Many things can cause abdominal pain. Usually, abdominal pain is not caused by a disease and will improve without treatment. It can often be observed and treated at home. Your health care provider will do a physical exam and possibly order blood tests and X-rays to help determine the seriousness of your pain. However, in many cases, more time must pass before a clear cause of the pain can be found. Before that point, your health care provider may not know if you need more testing or further treatment. HOME CARE INSTRUCTIONS Monitor your abdominal pain for any changes. The following actions may help to alleviate any discomfort you are experiencing:  Only take over-the-counter or prescription medicines as directed by your health care provider.  Do not take laxatives unless directed to do so by your health care provider.  Try a clear liquid diet (broth, tea, or water) as directed by your health care provider. Slowly move to a bland diet as tolerated. SEEK MEDICAL CARE IF:  You have unexplained abdominal pain.  You have abdominal pain associated with nausea or diarrhea.  You have pain when you urinate or have a bowel movement.  You experience abdominal pain that wakes you in the night.  You have abdominal pain that is worsened or improved by eating food.  You have abdominal pain that is worsened with eating fatty foods.  You have a fever. SEEK IMMEDIATE MEDICAL CARE IF:  Your pain does not go away within 2 hours.  You keep throwing up (vomiting).  Your pain is felt only in portions of the abdomen, such as the right side or the left lower portion of  the abdomen.  You pass bloody or black tarry stools. MAKE SURE YOU:  Understand these instructions.  Will watch your condition.  Will get help right away if you are not doing well or get worse.   This information is not intended to replace advice given to you by your health care provider. Make sure you discuss any questions you have with your health care provider.   Document Released: 11/14/2004 Document Revised: 10/26/2014 Document Reviewed: 10/14/2012 Elsevier Interactive Patient Education [employer]2016 Elsevier Inc.

## 2015-05-03 NOTE — ED Provider Notes (Signed)
CSN: [id number]     Arrival date & time 05/03/15  1552 History  By signing my name below, I, Freida Busman, attest that this documentation has been prepared under the direction and in the presence of non-physician practitioner, Joycie Peek, PA-C. Electronically Signed: Freida Busman, Scribe. 05/03/2015. 7:32 PM.  Chief Complaint  Patient presents with  . Abdominal Pain    The history is provided by the patient. No language interpreter was used.     HPI Comments:  Kelsey Castillo is a 25 y.o. female who presents to the Emergency Department complaining of diffuse abdominal pain x 3 days. Pt notes the pain started after she ate [place] food. She reports associated abdominal distention today and nausea, vomiting which resolved yesterday. She has taken OTC pain meds without relief. Her last BM was yesterday; states it was diarrhea but has not had another episode since. She denies sick contacts, recent international travel, rash, urinary symptoms, blood in stool, dysuria, and hematuria. Her LNMP was in Feb 2017. She currently has upper extremity implant for birth control.    Past Medical History  Diagnosis Date  . Molar pregnancy    Past Surgical History  Procedure Laterality Date  . Dilation and evacuation N/A 12/08/2014    Procedure: DILATATION AND EVACUATION;  Surgeon: Adam Phenix, MD;  Location: [facility];  Service: Gynecology;  Laterality: N/A;   Family History  Problem Relation Age of Onset  . Hypertension Father    Social History  Substance Use Topics  . Smoking status: Never Smoker   . Smokeless tobacco: Never Used  . Alcohol Use: No   OB History    Gravida Para Term Preterm AB TAB SAB Ectopic Multiple Living   0 1 1 0 0 0 2     Review of Systems  10 systems reviewed and all are negative for acute change except as noted in the HPI.  Allergies  Review of patient's allergies indicates no known allergies.  Home Medications   Prior to Admission medications    Medication Sig Start Date End Date Taking? Authorizing Provider  dicyclomine (BENTYL) 20 MG tablet Take 1 tablet (20 mg total) by mouth 2 (two) times daily. 05/03/15   Joycie Peek, PA-C  oxyCODONE-acetaminophen (PERCOCET/ROXICET) 5-325 MG tablet Take 1-2 tablets by mouth every 6 (six) hours as needed. Patient not taking: Reported on 12/26/2014 12/08/14   Adam Phenix, MD   BP 107/67 mmHg  Pulse 77  Temp(Src) 98.3 F (36.8 C) (Oral)  Resp 16  Ht  (1.6 m)  Wt 83.462 kg  BMI 32.60 kg/m2  SpO2 100%  LMP 04/08/2015 Physical Exam  Constitutional: She is oriented to person, place, and time. She appears well-developed and well-nourished. No distress.  HENT:  Head: Normocephalic and atraumatic.  Mouth/Throat: Oropharynx is clear and moist.  Eyes: Conjunctivae are normal. Pupils are equal, round, and reactive to light. Right eye exhibits no discharge. Left eye exhibits no discharge. No scleral icterus.  Neck: Normal range of motion. Neck supple.  Cardiovascular: Normal rate, regular rhythm and normal heart sounds.   Pulmonary/Chest: Effort normal and breath sounds normal. No respiratory distress. She has no wheezes. She has no rales.  Abdominal: Soft. She exhibits no distension and no mass. There is no tenderness. There is no rebound and no guarding.  Musculoskeletal: Normal range of motion. She exhibits no edema or tenderness.  Neurological: She is alert and oriented to person, place, and time. No cranial nerve deficit. Coordination  normal.  Cranial Nerves II-XII grossly intact  Skin: Skin is warm and dry. No rash noted.  Psychiatric: She has a normal mood and affect.  Nursing note and vitals reviewed.   ED Course  Procedures   DIAGNOSTIC STUDIES:  Oxygen Saturation is 100% on RA, normal by my interpretation.    COORDINATION OF CARE:  7:31 PM Discussed treatment plan with pt at bedside and pt agreed to plan.  Labs Review Labs Reviewed  COMPREHENSIVE METABOLIC PANEL -  Abnormal; Notable for the following:    Glucose, Bld 107 (*)    All other components within normal limits  CBC - Abnormal; Notable for the following:    MCH 25.5 (*)    All other components within normal limits  LIPASE, BLOOD  URINALYSIS, ROUTINE W REFLEX MICROSCOPIC (NOT [facility])  I-STAT BETA HCG BLOOD, ED ([facility], [facility], [facility] ONLY)    Imaging Review No results found. I have personally reviewed and evaluated these images and lab results as part of my medical decision-making.     Filed Vitals:   05/03/15 1600 05/03/15 2038  BP: 148/109 107/67  Pulse: 75 77  Temp: 98.3 F (36.8 C)   TempSrc: Oral   Resp: 22 16  Height: 5\' 3"  (1.6 m)   Weight: 83.462 kg   SpO2: 100% 100%   Meds given in ED:  Medications  dicyclomine (BENTYL) capsule 10 mg (10 mg Oral Given 05/03/15 1940)    Discharge Medication List as of 05/03/2015  8:25 PM    START taking these medications   Details  dicyclomine (BENTYL) 20 MG tablet Take 1 tablet (20 mg total) by mouth 2 (two) times daily., Starting 05/03/2015, Until Discontinued, Print         MDM   Presents for evaluation of diffuse abdominal discomfort since Sunday after eating [place] food. Nausea, vomiting and diarrhea have all resolved. Patient is nontoxic, nonseptic appearing, in no apparent distress.  Discomfort resolves with oral Bentyl.    Labs, imaging and vitals reviewed.  Patient does not meet the SIRS or Sepsis criteria.  On repeat exam patient does not have a surgical abdomen and there are no peritoneal signs.  No indication of appendicitis, bowel obstruction, bowel perforation, cholecystitis, diverticulitis, or ectopic pregnancy.  Patient discharged home with symptomatic treatment and given strict instructions for follow-up with their primary care physician.  I have also discussed reasons to return immediately to the ER.  Patient expresses understanding and agrees with plan.  Final diagnoses:  Abdominal discomfort, generalized      I  personally performed the services described in this documentation, which was scribed in my presence. The recorded information has been reviewed and is accurate.    Joycie PeekBenjamin Amour Cutrone, PA-C 05/03/15 2057  Zadie Rhineonald Wickline, MD 05/03/15 2102

## 2015-07-18 IMAGING — US US OB FOLLOW-UP
1 series · 12 of 28 positions shown · non-contrast
Comparison: none

[Series 1: us ob follow up · 12 of 62 slices shown]
[im 3/62]
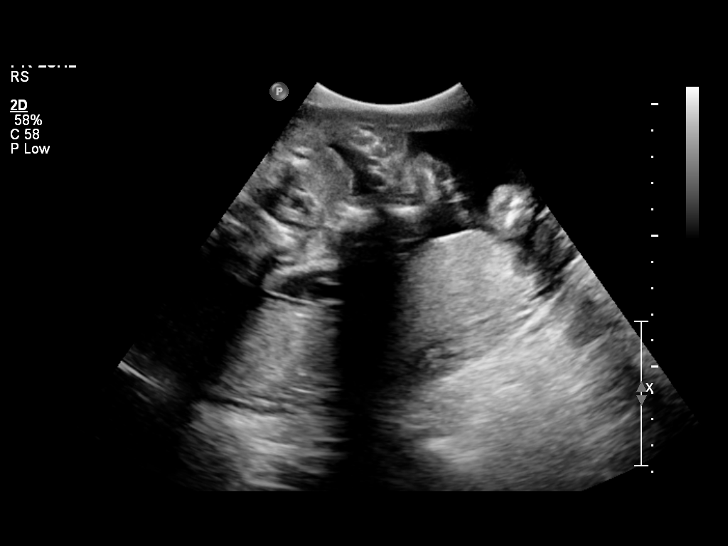
[im 7/62]
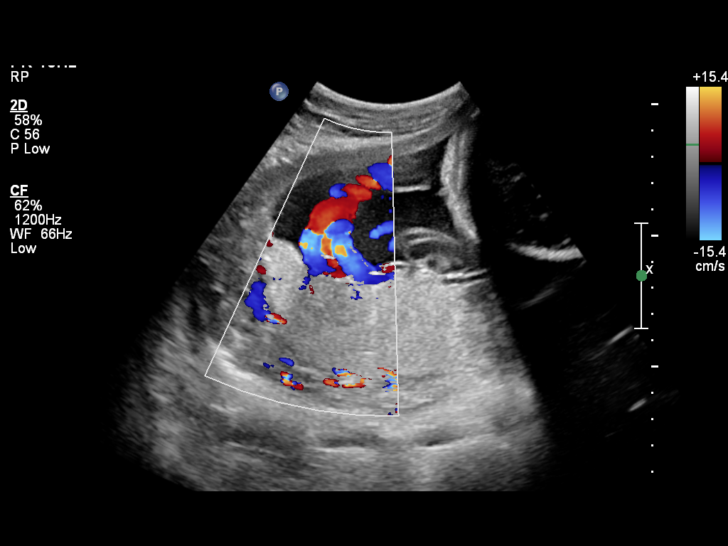
[im 12/62]
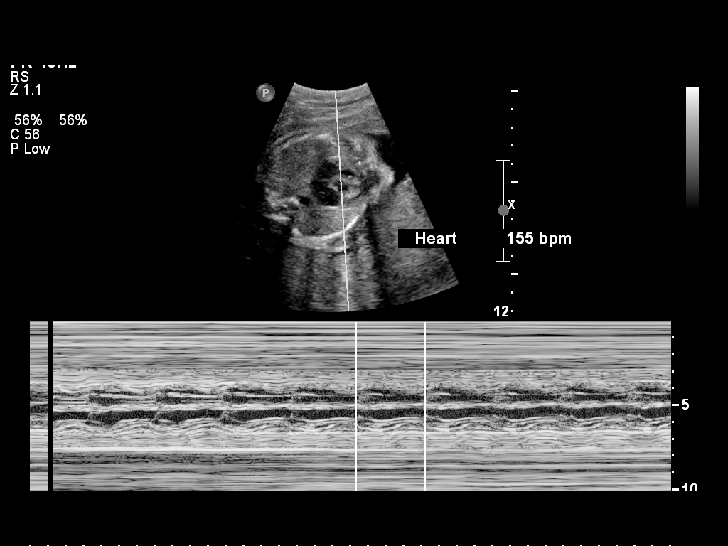
[im 19/62]
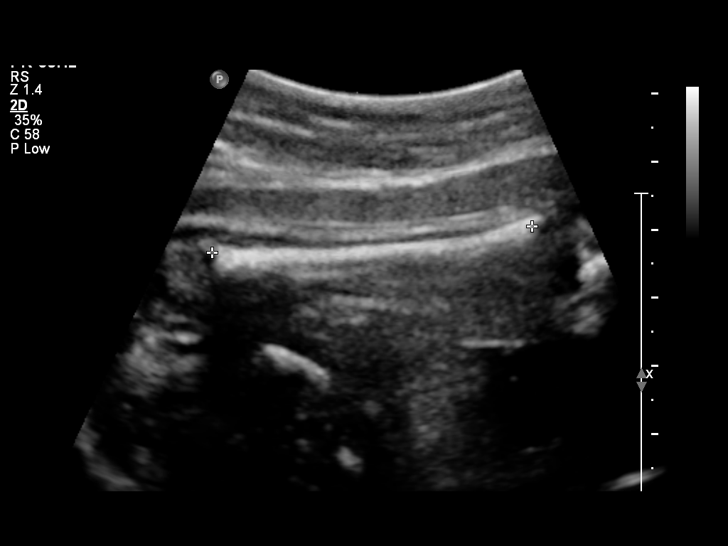
[im 23/62]
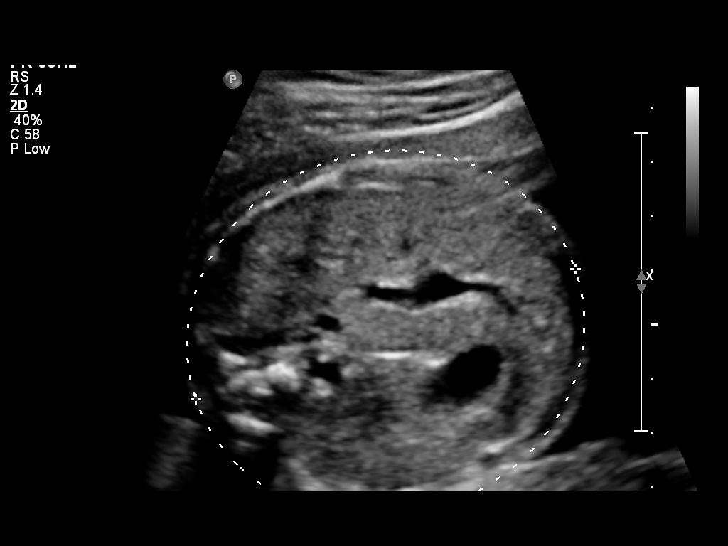
[im 28/62]
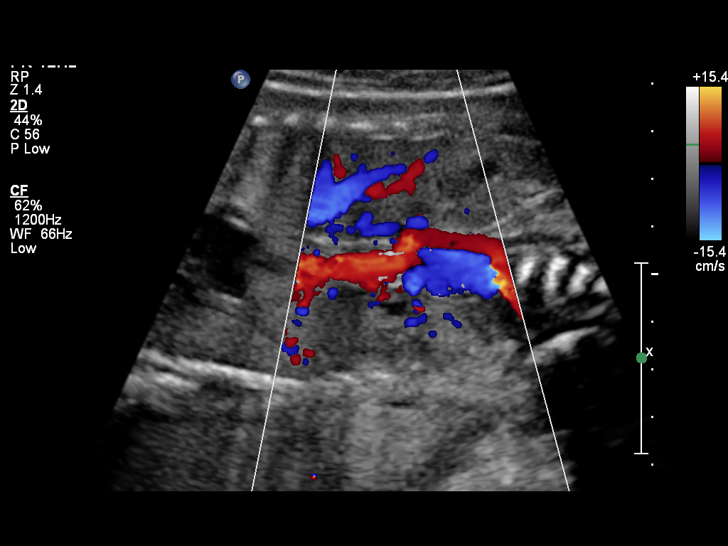
[im 34/62]
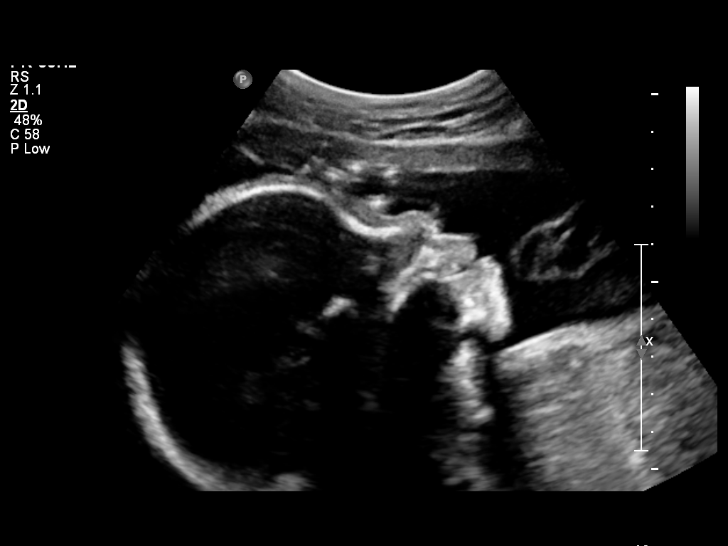
[im 39/62]
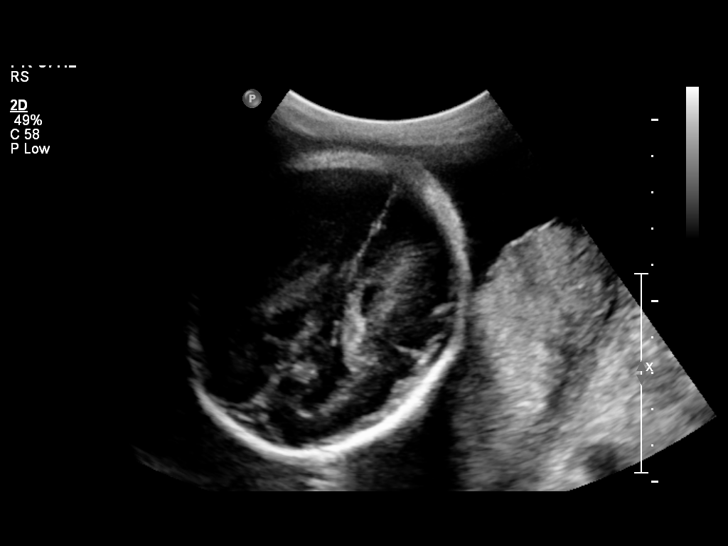
[im 43/62]
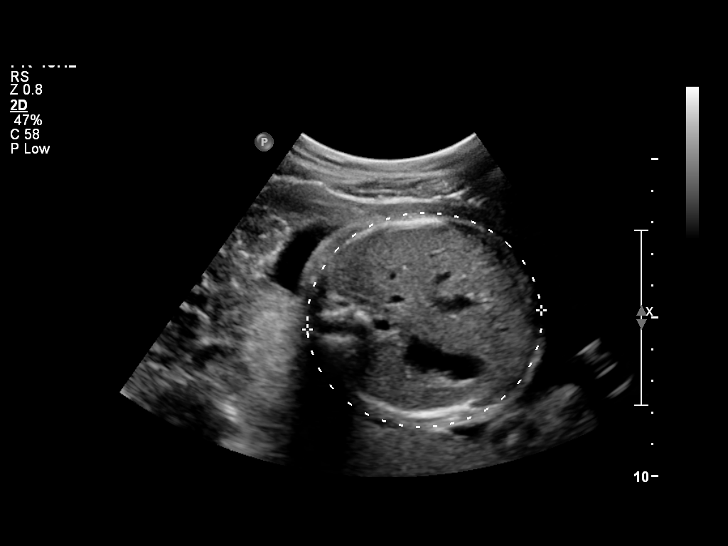
[im 50/62]
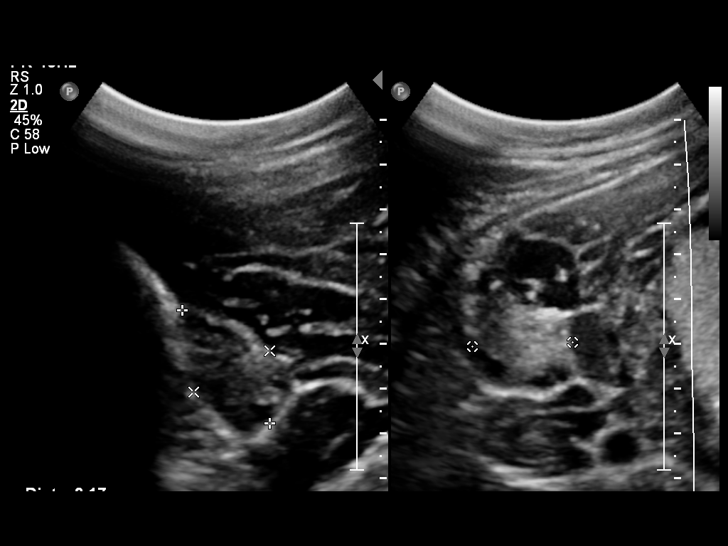
[im 55/62]
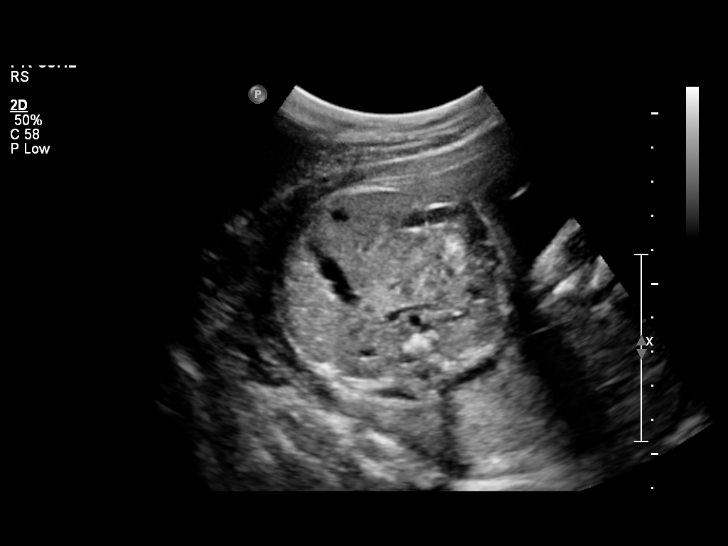
[im 59/62]
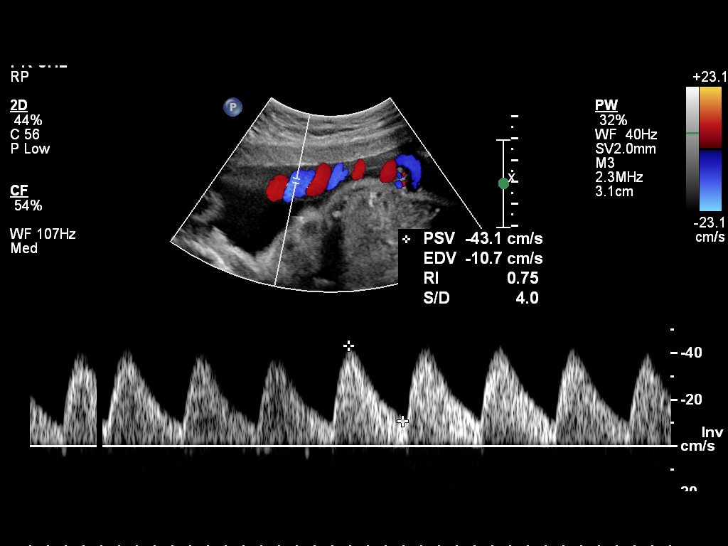

[12 of 28 positions shown; findings below may reference images not displayed]

OBSTETRICS REPORT
                      (Signed Final 06/28/2013 [DATE])

Service(s) Provided

 US OB FOLLOW UP                                       76816.1
 US UA CORD DOPPLER                                    76820.0
Indications

 Intrauterine Growth Restriction
Fetal Evaluation

 Num Of Fetuses:    1
 Fetal Heart Rate:  155                          bpm
 Cardiac Activity:  Observed
 Presentation:      Breech, footling
 Placenta:          Posterior, above cervical
                    os
 P. Cord            Visualized, central
 Insertion:

 Amniotic Fluid
 AFI FV:      Subjectively within normal limits
 AFI Sum:     14.19   cm       47  %Tile     Larg Pckt:    4.66  cm
 RUQ:   3.47    cm   RLQ:    2.31   cm    LUQ:   3.75    cm   LLQ:    4.66   cm
Biophysical Evaluation

 Amniotic F.V:   Within normal limits       F. Tone:        Observed
 F. Movement:    Observed                   Score:          [DATE]
 F. Breathing:   Observed
Biometry

 BPD:     67.5  mm     G. Age:  27w 1d                CI:        65.61   70 - 86
                                                      FL/HC:      18.7   19.6 -

 HC:     267.5  mm     G. Age:  29w 1d       19  %    HC/AC:      1.22   0.99 -

 AC:       219  mm     G. Age:  26w 3d      < 3  %    FL/BPD:     73.9   71 - 87
 FL:      49.9  mm     G. Age:  26w 6d      < 3  %    FL/AC:      22.8   20 - 24
 HUM:     46.2  mm     G. Age:  27w 2d        6  %

 Est. FW:     991  gm      2 lb 3 oz     16  %
Gestational Age
 LMP:           29w 1d        Date:  12/06/12                 EDD:   09/12/13
 U/S Today:     27w 3d                                        EDD:   09/24/13
 Best:          29w 1d     Det. By:  LMP  (12/06/12)          EDD:   09/12/13
Anatomy

 Cranium:          Appears normal         Aortic Arch:      Appears normal
 Fetal Cavum:      Appears normal         Ductal Arch:      Appears normal
 Ventricles:       Appears normal         Diaphragm:        Appears normal
 Choroid Plexus:   Appears normal         Stomach:          Appears normal
 Cerebellum:       Appears normal         Abdomen:          Appears normal
 Posterior Fossa:  Appears normal         Abdominal Wall:   Previously seen
 Nuchal Fold:      Previously seen        Cord Vessels:     Appears normal (3
                                                            vessel cord)
 Face:             Appears normal         Kidneys:          Appear normal
                   (orbits and profile)
 Lips:             Previously seen        Bladder:          Appears normal
 Heart:            Appears normal         Spine:            Previously seen
                   (4CH, axis, and
                   situs)
 RVOT:             Appears normal         Lower             Previously seen
                                          Extremities:
 LVOT:             Appears normal         Upper             Previously seen
                                          Extremities:

 Other:  Male gender.  Heels and 5th digit previously seen. Nasal bone
         visualized.
Doppler - Fetal Vessels

 Umbilical Artery
 S/D:   4.3            95  %tile
 Umbilical Artery
 Absent DFV:    No     Reverse DFV:    No

Cervix Uterus Adnexa

 Cervix:       Not visualized (advanced GA >05wks)
 Uterus:       No abnormality visualized.
 Cul De Sac:   No free fluid seen.

 Left Ovary:    Within normal limits.
 Right Ovary:   Within normal limits.
 Adnexa:     No abnormality visualized.
Impression

 SIUP at 29+1 weeks
 Normal interval anatomy; anatomic survey complete
 Normal amniotic fluid volume
 EFW at the 16th %tile; AC < 3rd %tile
 UA dopplers were in the high normal range for this GA
 BPP [DATE]
Recommendations

 Begin weekly BPPs and UA dopplers
 At 32 weeks, can switch to twice weekly NSTs with weekly
 AFIs and UA dopplers
 Growth in 3 weeks

 questions or concerns.

## 2015-07-23 ENCOUNTER — Emergency Department (HOSPITAL_COMMUNITY)
Admission: EM | Admit: 2015-07-23 | Discharge: 2015-07-23 | Disposition: A | Discharge status: Home / Self Care | Payer: BLUE CROSS/BLUE SHIELD | Attending: Emergency Medicine | Admitting: Emergency Medicine

## 2015-07-23 ENCOUNTER — Encounter (HOSPITAL_COMMUNITY): Payer: Self-pay

## 2015-07-23 DIAGNOSIS — Z79899 Other long term (current) drug therapy: Secondary | ICD-10-CM | POA: Diagnosis not present

## 2015-07-23 DIAGNOSIS — M545 Low back pain, unspecified: Secondary | ICD-10-CM

## 2015-07-23 DIAGNOSIS — M549 Dorsalgia, unspecified: Secondary | ICD-10-CM | POA: Diagnosis present

## 2015-07-23 MED ORDER — IBUPROFEN 600 MG PO TABS: 600.0000 mg | ORAL_TABLET | Freq: Three times a day (TID) | ORAL | Status: AC

## 2015-07-23 MED ORDER — CYCLOBENZAPRINE HCL 10 MG PO TABS: 10.0000 mg | ORAL_TABLET | Freq: Two times a day (BID) | ORAL | Status: AC | PRN

## 2015-07-23 NOTE — Discharge Instructions (Signed)
Back Pain, Adult °Back pain is very common in adults. The cause of back pain is rarely dangerous and the pain often gets better over time. The cause of your back pain may not be known. Some common causes of back pain include: °1. Strain of the muscles or ligaments supporting the spine. °2. Wear and tear (degeneration) of the spinal disks. °3. Arthritis. °4. Direct injury to the back. °For many people, back pain may return. Since back pain is rarely dangerous, most people can learn to manage this condition on their own. °HOME CARE INSTRUCTIONS °Watch your back pain for any changes. The following actions may help to lessen any discomfort you are feeling: °1. Remain active. It is stressful on your back to sit or stand in one place for long periods of time. Do not sit, drive, or stand in one place for more than 30 minutes at a time. Take short walks on even surfaces as soon as you are able. Try to increase the length of time you walk each day. °2. Exercise regularly as directed by your health care provider. Exercise helps your back heal faster. It also helps avoid future injury by keeping your muscles strong and flexible. °3. Do not stay in bed. Resting more than 1-2 days can delay your recovery. °4. Pay attention to your body when you bend and lift. The most comfortable positions are those that put less stress on your recovering back. Always use proper lifting techniques, including: °1. Bending your knees. °2. Keeping the load close to your body. °3. Avoiding twisting. °5. Find a comfortable position to sleep. Use a firm mattress and lie on your side with your knees slightly bent. If you lie on your back, put a pillow under your knees. °6. Avoid feeling anxious or stressed. Stress increases muscle tension and can worsen back pain. It is important to recognize when you are anxious or stressed and learn ways to manage it, such as with exercise. °7. Take medicines only as directed by your health care provider.  Over-the-counter medicines to reduce pain and inflammation are often the most helpful. Your health care provider may prescribe muscle relaxant drugs. These medicines help dull your pain so you can more quickly return to your normal activities and healthy exercise. °8. Apply ice to the injured area: °1. Put ice in a plastic bag. °2. Place a towel between your skin and the bag. °3. Leave the ice on for 20 minutes, 2-3 times a day for the first 2-3 days. After that, ice and heat may be alternated to reduce pain and spasms. °9. Maintain a healthy weight. Excess weight puts extra stress on your back and makes it difficult to maintain good posture. °SEEK MEDICAL CARE IF: °1. You have pain that is not relieved with rest or medicine. °2. You have increasing pain going down into the legs or buttocks. °3. You have pain that does not improve in one week. °4. You have night pain. °5. You lose weight. °6. You have a fever or chills. °SEEK IMMEDIATE MEDICAL CARE IF:  °1. You develop new bowel or bladder control problems. °2. You have unusual weakness or numbness in your arms or legs. °3. You develop nausea or vomiting. °4. You develop abdominal pain. °5. You feel faint. °  °This information is not intended to replace advice given to you by your health care provider. Make sure you discuss any questions you have with your health care provider. °  °Document Released: 02/04/2005 Document Revised: 02/25/2014 Document Reviewed: 06/08/2013 °Elsevier Interactive Patient Education ©2016 Elsevier   Inc. ° °Back Exercises °The following exercises strengthen the muscles that help to support the back. They also help to keep the lower back flexible. Doing these exercises can help to prevent back pain or lessen existing pain. °If you have back pain or discomfort, try doing these exercises 2-3 times each day or as told by your health care provider. When the pain goes away, do them once each day, but increase the number of times that you repeat the  steps for each exercise (do more repetitions). If you do not have back pain or discomfort, do these exercises once each day or as told by your health care provider. °EXERCISES °Single Knee to Chest °Repeat these steps 3-5 times for each leg: °5. Lie on your back on a firm bed or the floor with your legs extended. °6. Bring one knee to your chest. Your other leg should stay extended and in contact with the floor. °7. Hold your knee in place by grabbing your knee or thigh. °8. Pull on your knee until you feel a gentle stretch in your lower back. °9. Hold the stretch for 10-30 seconds. °10. Slowly release and straighten your leg. °Pelvic Tilt °Repeat these steps 5-10 times: °10. Lie on your back on a firm bed or the floor with your legs extended. °11. Bend your knees so they are pointing toward the ceiling and your feet are flat on the floor. °12. Tighten your lower abdominal muscles to press your lower back against the floor. This motion will tilt your pelvis so your tailbone points up toward the ceiling instead of pointing to your feet or the floor. °13. With gentle tension and even breathing, hold this position for 5-10 seconds. °Cat-Cow °Repeat these steps until your lower back becomes more flexible: °7. Get into a hands-and-knees position on a firm surface. Keep your hands under your shoulders, and keep your knees under your hips. You may place padding under your knees for comfort. °8. Let your head hang down, and point your tailbone toward the floor so your lower back becomes rounded like the back of a cat. °9. Hold this position for 5 seconds. °10. Slowly lift your head and point your tailbone up toward the ceiling so your back forms a sagging arch like the back of a cow. °11. Hold this position for 5 seconds. °Press-Ups °Repeat these steps 5-10 times: °6. Lie on your abdomen (face-down) on the floor. °7. Place your palms near your head, about shoulder-width apart. °8. While you keep your back as relaxed as  possible and keep your hips on the floor, slowly straighten your arms to raise the top half of your body and lift your shoulders. Do not use your back muscles to raise your upper torso. You may adjust the placement of your hands to make yourself more comfortable. °9. Hold this position for 5 seconds while you keep your back relaxed. °10. Slowly return to lying flat on the floor. °Bridges °Repeat these steps 10 times: °1. Lie on your back on a firm surface. °2. Bend your knees so they are pointing toward the ceiling and your feet are flat on the floor. °3. Tighten your buttocks muscles and lift your buttocks off of the floor until your waist is at almost the same height as your knees. You should feel the muscles working in your buttocks and the back of your thighs. If you do not feel these muscles, slide your feet 1-2 inches farther away from your buttocks. °4. Hold this   position for 3-5 seconds. °5. Slowly lower your hips to the starting position, and allow your buttocks muscles to relax completely. °If this exercise is too easy, try doing it with your arms crossed over your chest. °Abdominal Crunches °Repeat these steps 5-10 times: °1. Lie on your back on a firm bed or the floor with your legs extended. °2. Bend your knees so they are pointing toward the ceiling and your feet are flat on the floor. °3. Cross your arms over your chest. °4. Tip your chin slightly toward your chest without bending your neck. °5. Tighten your abdominal muscles and slowly raise your trunk (torso) high enough to lift your shoulder blades a tiny bit off of the floor. Avoid raising your torso higher than that, because it can put too much stress on your low back and it does not help to strengthen your abdominal muscles. °6. Slowly return to your starting position. °Back Lifts °Repeat these steps 5-10 times: °1. Lie on your abdomen (face-down) with your arms at your sides, and rest your forehead on the floor. °2. Tighten the muscles in your  legs and your buttocks. °3. Slowly lift your chest off of the floor while you keep your hips pressed to the floor. Keep the back of your head in line with the curve in your back. Your eyes should be looking at the floor. °4. Hold this position for 3-5 seconds. °5. Slowly return to your starting position. °SEEK MEDICAL CARE IF: °· Your back pain or discomfort gets much worse when you do an exercise. °· Your back pain or discomfort does not lessen within 2 hours after you exercise. °If you have any of these problems, stop doing these exercises right away. Do not do them again unless your health care provider says that you can. °SEEK IMMEDIATE MEDICAL CARE IF: °· You develop sudden, severe back pain. If this happens, stop doing the exercises right away. Do not do them again unless your health care provider says that you can. °  °This information is not intended to replace advice given to you by your health care provider. Make sure you discuss any questions you have with your health care provider. °  °Document Released: 03/14/2004 Document Revised: 10/26/2014 Document Reviewed: 03/31/2014 °Elsevier Interactive Patient Education ©2016 [employer] Inc. ° °

## 2015-07-23 NOTE — ED Notes (Signed)
Patient here with lower back pain with radiation to right leg after bending over last night, has not taken any medications for same

## 2015-07-23 NOTE — ED Provider Notes (Signed)
CSN: [id number]     Arrival date & time 07/23/15  [id number] History  By signing my name below, I, Soijett Blue, attest that this documentation has been prepared under the direction and in the presence of Bethel BornKelly Marie Illianna Paschal, PA-C Electronically Signed: Soijett Blue, ED Scribe. 07/23/2015. 10:26 AM.   Chief Complaint  Patient presents with  . Back Pain   The history is provided by the patient. No language interpreter was used.   HPI Comments: Kelsey Castillo is a 25 y.o. female who presents to the Emergency Department complaining of right lower back pain onset last night. Pt notes that she went to bend over to remove clothing last night and began to have lower back pain that does radiate to her right foot. Pt right lower back pain is worsened with movement. Pt states that she has had these symptoms in the past but not to the extent of her current episode. Denies injury to her right lower back in the past. Pt reports that when she ambulates there is shooting pain that is localized to her right hip. She states that she is having associated symptoms of gait problem due to pain. She states that she has not tried any medications for the relief for her symptoms. Pt denies abdominal pain, n/v, difficulty urinating, numbness, tingling, neck pain, fever, and any other symptoms. 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    Past Medical History  Diagnosis Date  . Molar pregnancy    Past Surgical History  Procedure Laterality Date  . Dilation and evacuation N/A 12/08/2014    Procedure: DILATATION AND EVACUATION;  Surgeon: Adam PhenixJames G Arnold, MD;  Location: [facility];  Service: Gynecology;  Laterality: N/A;   Family History  Problem Relation Age of Onset  . Hypertension Father    Social History  Substance Use Topics  . Smoking status: Never Smoker   . Smokeless tobacco: Never Used  . Alcohol Use: No   OB History    Gravida Para Term Preterm AB TAB SAB Ectopic Multiple Living   4  2 2  0 1 1 0 0 0 2     Review of Systems  Constitutional: Negative for fever.  Gastrointestinal: Negative for nausea, vomiting and abdominal pain.  Genitourinary: Negative for difficulty urinating.  Musculoskeletal: Positive for back pain and gait problem (due to pain). Negative for neck pain.  Neurological: Negative for numbness.       No tingling    Allergies  Review of patient's allergies indicates no known allergies.  Home Medications   Prior to Admission medications   Medication Sig Start Date End Date Taking? Authorizing Provider  dicyclomine (BENTYL) 20 MG tablet Take 1 tablet (20 mg total) by mouth 2 (two) times daily. 05/03/15   Joycie PeekBenjamin Cartner, PA-C  oxyCODONE-acetaminophen (PERCOCET/ROXICET) 5-325 MG tablet Take 1-2 tablets by mouth every 6 (six) hours as needed. Patient not taking: Reported on 12/26/2014 12/08/14   Adam PhenixJames G Arnold, MD   BP 113/75 mmHg  Pulse 79  Temp(Src) 98.7 F (37.1 C) (Oral)  Resp 18  SpO2 100%  LMP 10/03/2014   Physical Exam  Constitutional: She is oriented to person, place, and time. She appears well-developed and well-nourished. No distress.  HENT:  Head: Normocephalic and atraumatic.  Eyes: Conjunctivae are normal. Pupils are equal, round, and reactive to light. Right eye exhibits no discharge. Left eye exhibits no discharge. No scleral icterus.  Neck: Normal range of motion.  Cardiovascular:  Normal rate.   Pulmonary/Chest: Effort normal. No respiratory distress.  Abdominal: Soft. Bowel sounds are normal. She exhibits no distension. There is no tenderness.  Musculoskeletal:  Tenderness over the right paraspinal muscles. Inspection: No masses, deformity, or rash Palpation: No midline spinal tenderness. No paraspinal muscle tenderness. ROM: Normal flexion, extension, lateral rotation and flexion of back.  Strength: 5/5 in lower extremities and normal plantar and dorsiflexion Sensation: Intact sensation with light touch in lower extremities  bilaterally Gait: Normal gait Reflexes: Patellar reflex is 2+ bilaterally, Achilles is 2+ bilaterally SLR: Negative seated straight leg raise  Neurological: She is alert and oriented to person, place, and time.  Skin: Skin is warm and dry.  Psychiatric: She has a normal mood and affect. Her behavior is normal.  Nursing note and vitals reviewed.   ED Course  Procedures (including critical care time) DIAGNOSTIC STUDIES: Oxygen Saturation is 100% on RA, nl by my interpretation.    COORDINATION OF CARE: 10:19 AM Discussed treatment plan with pt at bedside which includes ibuprofen Rx, rest, ice, and flexeril Rx and pt agreed to plan.   MDM   Final diagnoses:  Right-sided low back pain without sciatica   Patient with back pain.  No neurological deficits and normal neuro exam.  Patient is ambulatory.  No loss of bowel or bladder control.  No concern for cauda equina.  No fever, night sweats, weight loss, h/o cancer, IVDA, no recent procedure to back. No urinary symptoms suggestive of UTI.  Pt will be discharged with Rx ibuprofen and flexeril. Supportive care and return precaution discussed. Appears safe for discharge at this time. Follow up as indicated in discharge paperwork.   I personally performed the services described in this documentation, which was scribed in my presence. The recorded information has been reviewed and is accurate.    Bethel Born, PA-C 07/23/15 1605  Margarita Grizzle, MD 07/24/15 [phone]

## 2015-08-02 IMAGING — US US FETAL BPP W/O NONSTRESS
1 series · 13 of 16 positions shown · non-contrast
Comparison: none

OBSTETRICS REPORT
                      (Signed Final 07/13/2013 [DATE])

Service(s) Provided
 US UA DOPPLER RE-EVAL                                 76828.1
Indications
 Intrauterine Growth Restriction
 No or Little Prenatal Care
Fetal Evaluation
 Num Of Fetuses:    1
 Fetal Heart Rate:  131                          bpm
 Cardiac Activity:  Observed
 Presentation:      Cephalic
 Placenta:          Posterior, above cervical
                    os
 P. Cord            Previously Visualized
 Insertion:
 Amniotic Fluid
 AFI FV:      Subjectively within normal limits
                                             Larg Pckt:     5.0  cm
Biophysical Evaluation
 Amniotic F.V:   Pocket => 2 cm two         F. Tone:        Observed
                 planes
 F. Movement:    Observed                   N.S.T:          Reactive
 F. Breathing:   Not Observed               Score:          [DATE]
Gestational Age
 LMP:           31w 2d        Date:  12/06/12                 EDD:   09/12/13
 Best:          29w 4d     Det. By:  U/S    (06/28/13)        EDD:   09/24/13
Anatomy
 Stomach:          Appears normal, left   Bladder:          Appears normal
                   sided
Doppler - Fetal Vessels
 Umbilical Artery
 S/D:   2.41           24  %tile       RI:
 PI:    0.86                           PSV:       39.83   cm/s
 Absent DFV:    No     Reverse DFV:    No
Impression
INDICATION: 23 yr old [id number] at 07w5d by early ultrasound
 with concern for lagging fetal growth for BPP and Doppler
 studies.

[Series 1: us fetal bpp w/o nonstress · 0.23mm/px · 16 acquisitions, 13 frames shown]
[im 1/16]
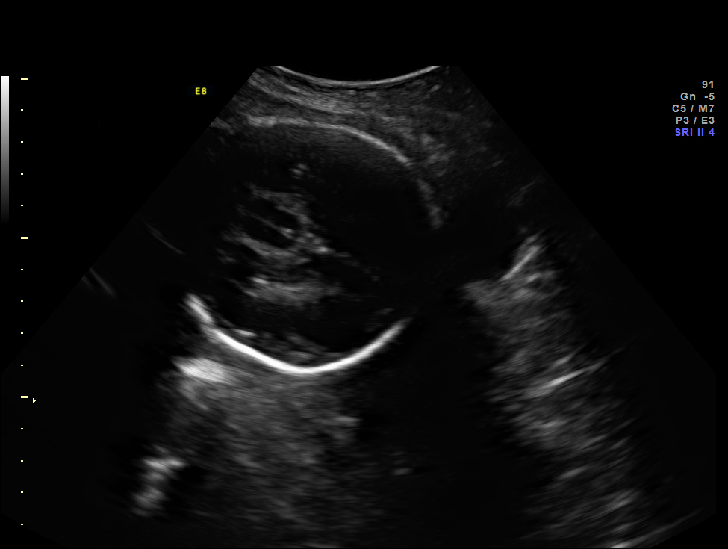
[im 2/16]
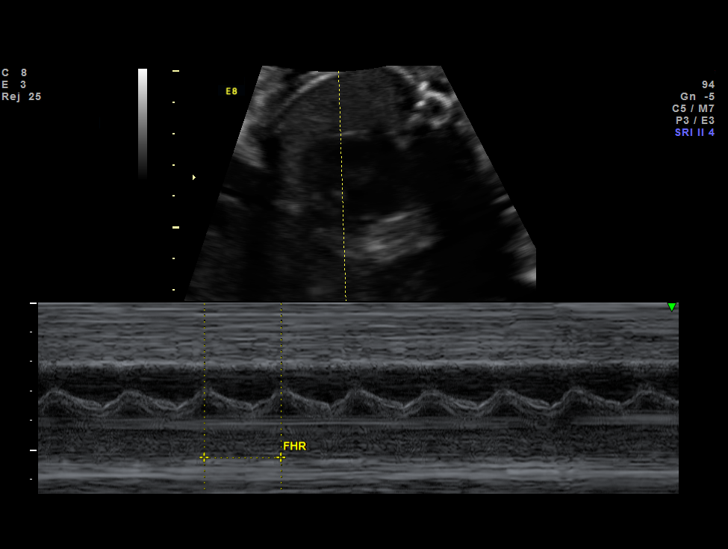
[im 4/16]
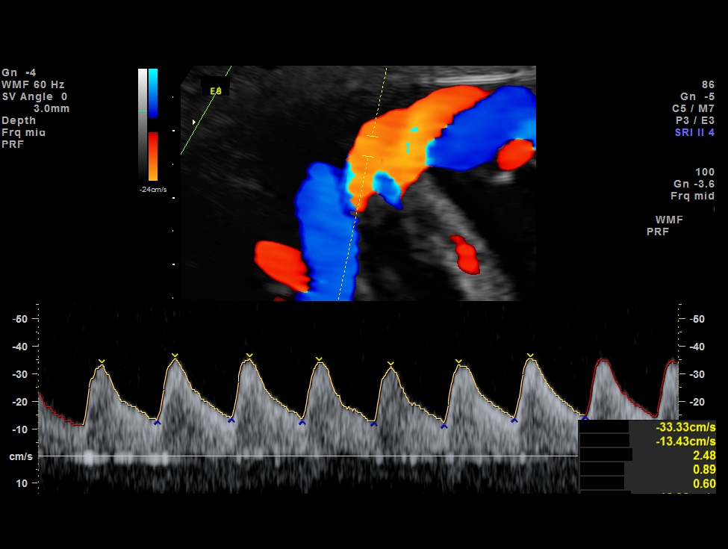
[im 5/16]
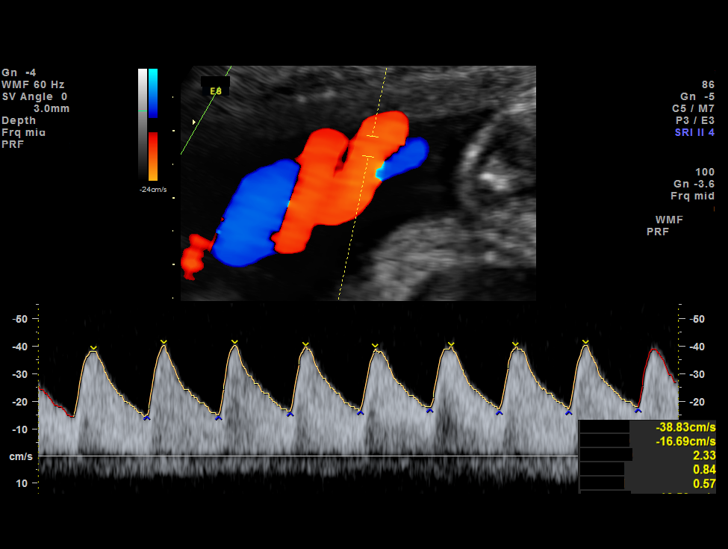
[im 6/16]
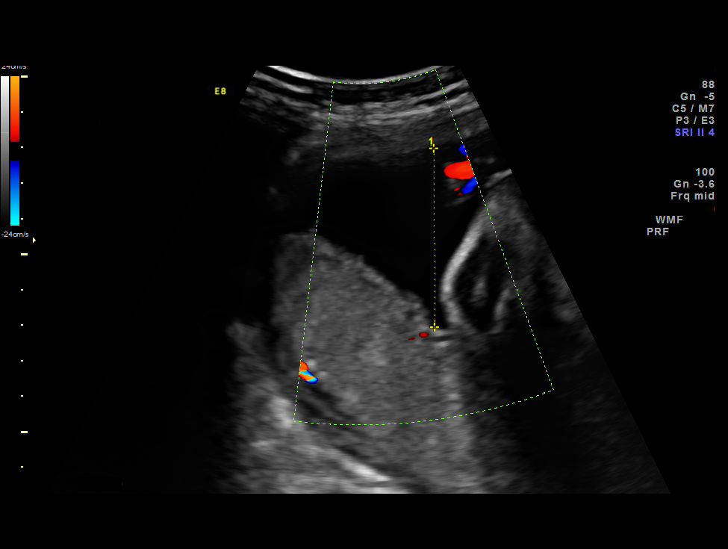
[im 7/16]
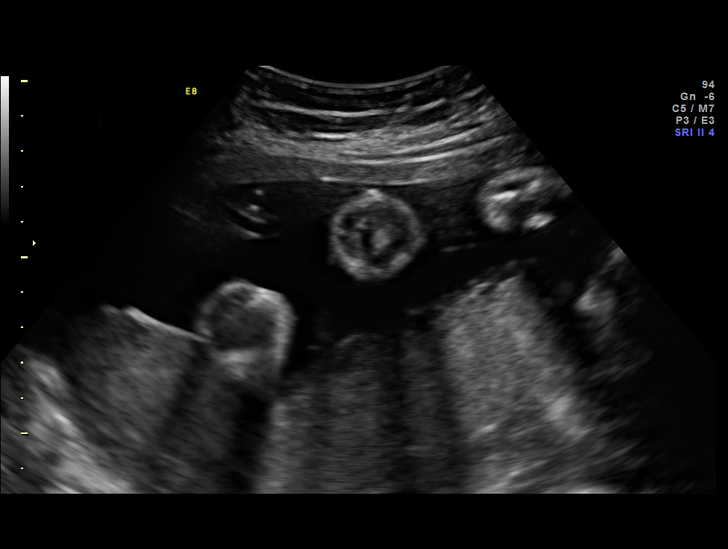
[im 9/16]
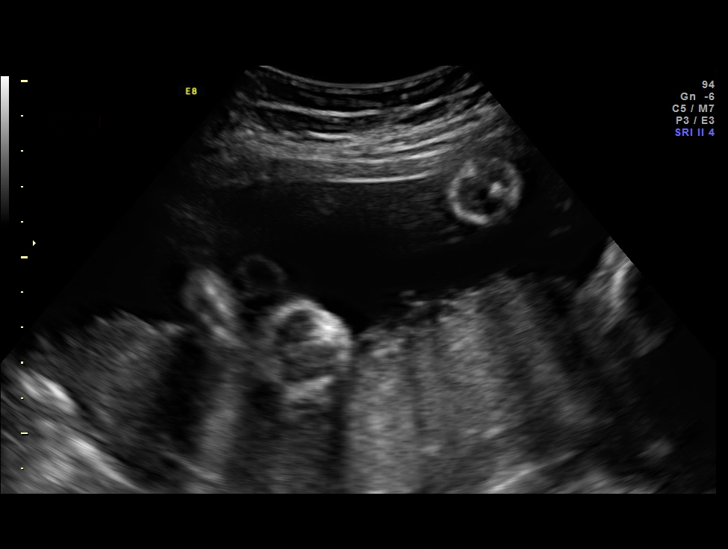
[im 10/16]
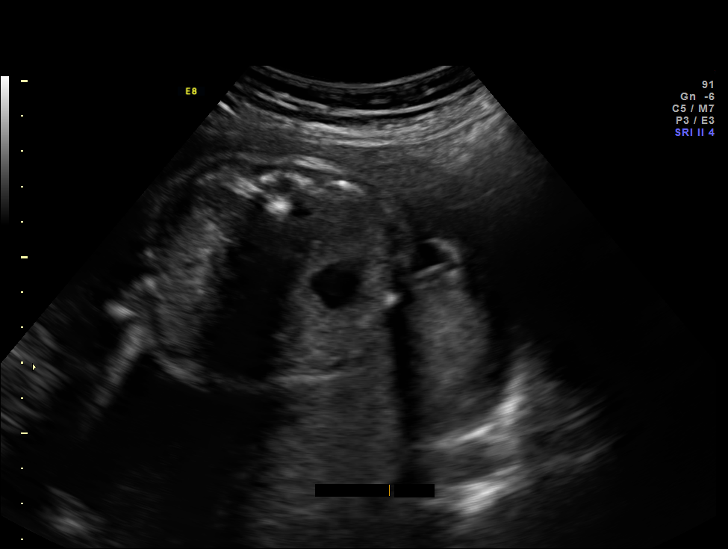
[im 11/16]
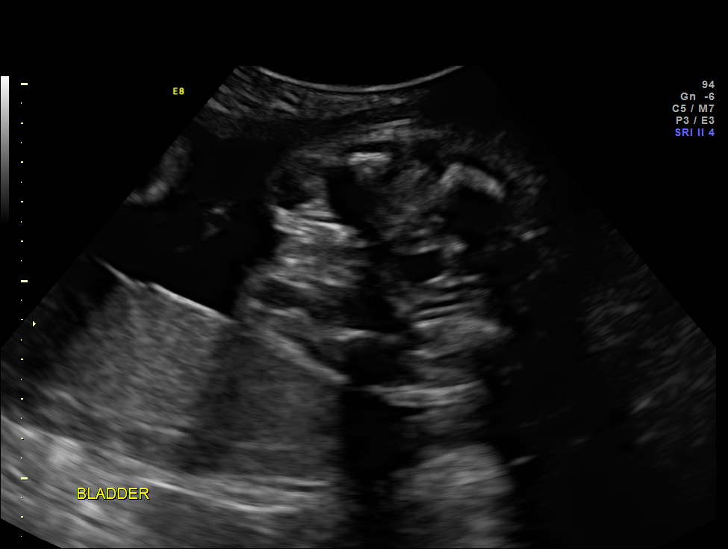
[im 12/16]
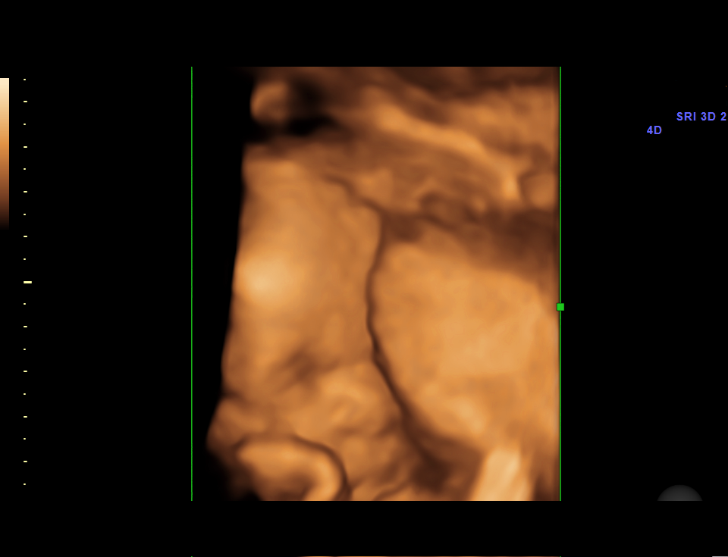
[im 13/16]
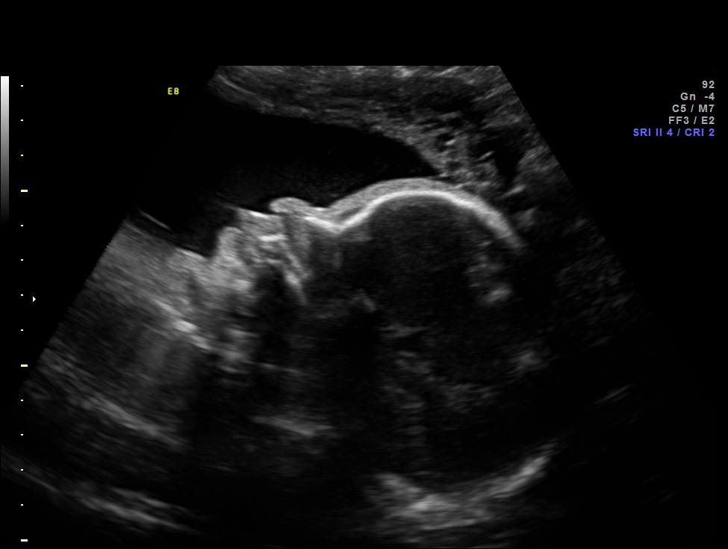
[im 15/16]
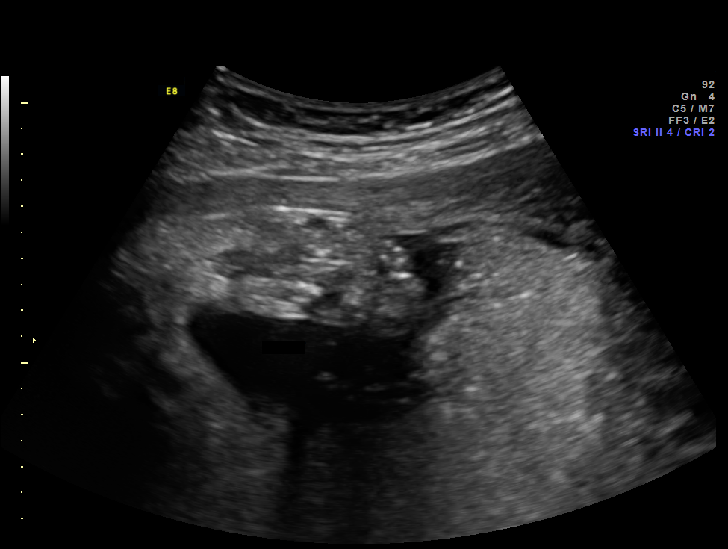
[im 16/16]
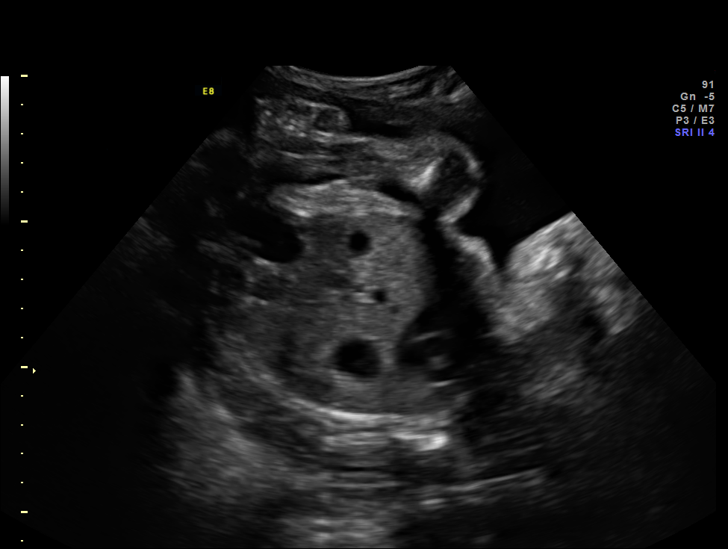

[13 of 16 positions shown; findings below may reference images not displayed]

FINDINGS: 1. Single intrauterine pregnancy.
 2. Posterior placenta without evidence of previa.
 3. Normal amniotic fluid volume.
 4. Normal umbilical artery Doppler studies.
 5. Biophysical profile is [DATE] (-2 for breathing).
Recommendations

 1. Concern for lagging fetal growth:
 - new due date discovered in patient's records from early
 ultrasound
 - repeat fetal growth in 1 week using new due date
 2. Overall  BPP [DATE] (reactive with 90x90 accels for <32
 weeks)

 questions or concerns.

## 2015-08-07 ENCOUNTER — Telehealth: Payer: Self-pay | Admitting: General Practice

## 2015-08-07 NOTE — Telephone Encounter (Signed)
Patient called and left message stating she needs an appt for lab hormone check. Patient states she had a molar pregnancy in November and hasn't had her hormone levels checked since January. Patient states she was recently on her period & just started to bleed again. Called patient, no answer- left message stating we are trying to reach you to return your phone call, please call us back at the clinics

## 2015-08-09 NOTE — Telephone Encounter (Signed)
Returned patient's call. She would like to come in tomorrow if possible for b-hcg draw as she has not gotten any of her levels since January. I had previously discussed this with Dr Debroah LoopArnold who said she definitely should come in. Patient stated she would come in tomorrow morning. Will advise front desk to add to lab schedule.

## 2015-08-29 ENCOUNTER — Telehealth: Payer: Self-pay | Admitting: Obstetrics & Gynecology

## 2015-08-29 NOTE — Telephone Encounter (Signed)
Called patient about lab appointment that we need to make. Had to leave message.

## 2016-09-25 LAB — HEPATITIS C ANTIBODY (EXT): HEPATITIS C ANTIBODY (EXT): 0.15 {index_val} (ref <0.80)

## 2016-09-26 LAB — CHLAMYDIA/GC (EXT)
Chlamydia trachomatis, Urine (EXT): POSITIVE — AB
Neisseria Gonorrhoeae, Urine (EXT): NEGATIVE

## 2016-11-07 LAB — LIPID PROFILE (EXT)
Chol/HDL Ratio (EXT): 3.4 (ref <=4.9)
Cholesterol (EXT): 155 mg/dL (ref <=199)
HDL Cholesterol (EXT): 45 mg/dL (ref >=41)
LDL Cholesterol, CALC (EXT): 101.4 mg/dL (ref <=130)
Triglycerides (EXT): 43 mg/dL (ref <=149)

## 2017-11-14 ENCOUNTER — Emergency Department
Admit: 2017-11-14 | Disposition: A | Discharge status: Home / Self Care | Source: Home / Self Care | Attending: Nurse Practitioner | Admitting: Nurse Practitioner

## 2017-11-14 LAB — HX HEPATITIS B SURFACE ANTIGEN
CASE NUMBER: 2019270003872
HX HBSAG MEASURED: 0.15 — NL (ref 0.0–0.99)
HX HEP B SURFACE AG: NEGATIVE
HX HEP BS AG INST: NONREACTIVE

## 2017-11-14 LAB — HX HEPATITIS B SURFACE ANTIBODY
CASE NUMBER: 2019270003872
HX HEP B SURFACE AB CONCENTRATION: 311
HX HEP B SURFACE AB: REACTIVE — AB

## 2017-11-14 LAB — HX COMPREHENSIVE METABOLIC PANEL
CASE NUMBER: 2019270003872
HX ALBUMIN LVL: 3.5 g/dL — NL (ref 3.2–5.0)
HX ALKALINE PHOSPHATASE: 46 U/L — NL (ref 30.0–117.0)
HX ALT: 17 U/L — NL (ref 6.0–55.0)
HX ANION GAP: 5 — NL (ref 3.0–11.0)
HX AST: 12 U/L — NL (ref 6.0–40.0)
HX BILIRUBIN TOTAL: 0.2 mg/dL — NL (ref 0.2–1.2)
HX BUN: 10 mg/dL — NL (ref 6.0–20.0)
HX CALCIUM LVL: 8.5 mg/dL — NL (ref 8.5–10.5)
HX CHLORIDE: 106 mmol/L — NL (ref 98.0–110.0)
HX CO2: 28 mmol/L — NL (ref 21.0–32.0)
HX CREATININE: 0.71 mg/dL — NL (ref 0.55–1.3)
HX GLUCOSE LVL: 80 mg/dL — NL (ref 70.0–110.0)
HX POTASSIUM LVL: 3.7 mmol/L — NL (ref 3.6–5.2)
HX SODIUM LVL: 139 mmol/L — NL (ref 136.0–146.0)
HX TOTAL PROTEIN: 7.5 g/dL — NL (ref 6.0–8.4)

## 2017-11-14 LAB — HX .AUTOMATED DIFF
CASE NUMBER: 2019270003872
HX ABSOLUTE BASO COUNT: 0.02 10*3/uL — NL (ref 0.0–0.22)
HX ABSOLUTE EOS COUNT: 0.1 10*3/uL — NL (ref 0.0–0.45)
HX ABSOLUTE LYMPHS COUNT: 2.69 10*3/uL — NL (ref 0.74–5.04)
HX ABSOLUTE MONO COUNT: 0.53 10*3/uL — NL (ref 0.0–1.34)
HX ABSOLUTE NEUTRO COUNT: 3.87 10*3/uL — NL (ref 1.48–7.95)
HX BASOPHILS: 0.3 %
HX EOSINOPHILS: 1.4 %
HX IMMATURE GRANULOCYTES: 0.3 % — NL (ref 0.0–2.0)
HX LYMPHOCYTES: 37.2 %
HX MONOCYTES: 7.3 %
HX NEUTROPHILS: 53.5 %

## 2017-11-14 LAB — HX HEPATITIS C ANTIBODY
CASE NUMBER: 2019270003872
HX HEP C AB INST: NONREACTIVE
HX HEP C AB MEASURED: 0.15 — NL (ref 0.0–0.8)
HX HEP C AB: NONREACTIVE

## 2017-11-14 LAB — HX CBC W/ DIFF
CASE NUMBER: 2019270003872
HX ABSOLUTE NRBC COUNT: 0 10*3/uL
HX HCT: 34.7 % — ABNORMAL LOW (ref 36.0–47.0)
HX HGB: 11.3 g/dL — ABNORMAL LOW (ref 11.8–16.0)
HX MCH: 26.5 pg — NL (ref 26.0–34.0)
HX MCHC: 32.6 g/dL — NL (ref 31.0–37.0)
HX MCV: 81.5 fL — NL (ref 80.0–100.0)
HX MPV: 10.1 fL — NL (ref 9.4–12.4)
HX NRBC PERCENT: 0 % — NL
HX PLATELET: 241 10*3/uL — NL (ref 150.0–400.0)
HX RBC: 4.26 10*6/uL — NL (ref 3.9–5.2)
HX RDW-CV: 12.3 % — NL (ref 11.5–14.5)
HX RDW-SD: 36.4 fL — NL (ref 35.0–51.0)
HX WBC: 7.2 10*3/uL — NL (ref 3.7–11.2)

## 2017-11-14 LAB — HX HIV 1/2 ANTIGEN/ANTIBODY COMBINATION ASS
CASE NUMBER: 2019270003873
HX HIV-1/HIV-2 AG/AB COMBO SCREEN: NONREACTIVE

## 2017-11-14 LAB — HX GLOMERULAR FILTRATION RATE (ESTIMATED)
CASE NUMBER: 2019270003872
HX AFN AMER GLOMERULAR FILTRATION RATE: >90
HX NON-AFN AMER GLOMERULAR FILTRATION RATE: >90

## 2017-11-14 LAB — HX HCG QUALITATIVE
CASE NUMBER: 2019270003872
HX HCG QUALITATIVE: NEGATIVE — NL

## 2018-01-01 ENCOUNTER — Encounter: Payer: Self-pay | Admitting: *Deleted

## 2018-04-20 ENCOUNTER — Emergency Department
Admit: 2018-04-20 | Disposition: A | Discharge status: Home / Self Care | Source: Home / Self Care | Attending: Emergency Medicine | Admitting: Emergency Medicine

## 2018-07-02 ENCOUNTER — Encounter: Payer: Self-pay | Admitting: *Deleted

## 2019-10-25 ENCOUNTER — Emergency Department
Admit: 2019-10-25 | Discharge status: Against Medical Advice | Source: Home / Self Care | Attending: Registered" | Admitting: Registered"

## 2019-11-06 LAB — HEPATITIS C ANTIBODY (EXT): HEPATITIS C ANTIBODY (EXT): <0.02 {index_val} (ref <0.80)

## 2019-11-10 ENCOUNTER — Emergency Department
Admit: 2019-11-10 | Discharge status: Against Medical Advice | Source: Home / Self Care | Attending: Emergency Medicine | Admitting: Emergency Medicine

## 2020-05-04 ENCOUNTER — Ambulatory Visit

## 2020-05-04 ENCOUNTER — Ambulatory Visit: Admitting: Family

## 2020-05-04 LAB — HX URINALYSIS (COMPLETE)
CASE NUMBER: 2022076003754
HX UA BILIRUBIN: NEGATIVE — NL
HX UA BLOOD: NEGATIVE — NL
HX UA GLUCOSE: NEGATIVE — NL
HX UA KETONES: NEGATIVE — NL
HX UA LEUKOCYTE ESTERASE: NEGATIVE — NL
HX UA NITRITE: NEGATIVE — NL
HX UA PH: 7 — NL (ref 5.0–8.0)
HX UA PROTEIN: NEGATIVE — NL
HX UA RBC: <1 — NL (ref 0.0–2.0)
HX UA SPECIFIC GRAVITY: 1.016 — NL (ref 1.003–1.03)
HX UA SQUAMOUS EPITHELIAL: 2 /HPF — NL (ref 0.0–5.0)
HX UA UROBILINOGEN: NEGATIVE — NL
HX UA WBC: 1 /HPF — NL (ref 0.0–5.0)

## 2020-05-04 LAB — HX COMPREHENSIVE METABOLIC PANEL
CASE NUMBER: 2022076002395
HX ALBUMIN LVL: 3.8 g/dL — NL (ref 3.2–5.0)
HX ALKALINE PHOSPHATASE: 37 U/L — NL (ref 30.0–117.0)
HX ALT: 30 U/L — NL (ref 6.0–55.0)
HX ANION GAP: 5 — NL (ref 3.0–11.0)
HX AST: 23 U/L — NL (ref 6.0–40.0)
HX BILIRUBIN TOTAL: 0.4 mg/dL — NL (ref 0.2–1.2)
HX BUN: 7 mg/dL — NL (ref 6.0–20.0)
HX CALCIUM LVL: 9.3 mg/dL — NL (ref 8.5–10.5)
HX CHLORIDE: 108 mmol/L — NL (ref 98.0–110.0)
HX CO2: 27 mmol/L — NL (ref 21.0–32.0)
HX CREATININE: 0.739 mg/dL — NL (ref 0.55–1.3)
HX GLUCOSE LVL: 87 mg/dL — NL (ref 70.0–110.0)
HX POTASSIUM LVL: 3.9 mmol/L — NL (ref 3.6–5.2)
HX SODIUM LVL: 140 mmol/L — NL (ref 136.0–146.0)
HX TOTAL PROTEIN: 7.5 g/dL — NL (ref 6.0–8.4)

## 2020-05-04 LAB — HX CBC W/ INDICES
CASE NUMBER: 2022076002395
HX ABSOLUTE NRBC COUNT: 0 10*3/uL
HX HCT: 36.6 % — NL (ref 36.0–47.0)
HX HGB: 11.6 g/dL — ABNORMAL LOW (ref 11.8–16.0)
HX MCH: 27.5 pg — NL (ref 26.0–34.0)
HX MCHC: 31.7 g/dL — NL (ref 31.0–37.0)
HX MCV: 86.7 fL — NL (ref 80.0–100.0)
HX MPV: 10.9 fL — NL (ref 9.4–12.4)
HX NRBC PERCENT: 0 % — NL
HX PLATELET: 230 10*3/uL — NL (ref 150.0–400.0)
HX RBC: 4.22 10*6/uL — NL (ref 3.9–5.2)
HX RDW-CV: 13.1 % — NL (ref 11.5–14.5)
HX RDW-SD: 40.8 fL — NL (ref 35.0–51.0)
HX WBC: 5.1 10*3/uL — NL (ref 3.7–11.2)

## 2020-05-04 LAB — HX LIPID PANEL
CASE NUMBER: 2022076002395
HX CHOL: 164 mg/dL — NL
HX HDL: 71 mg/dL — NL
HX LDL: 86 mg/dL — NL
HX TRIG: 37 mg/dL — NL

## 2020-05-04 LAB — HX GLOMERULAR FILTRATION RATE (ESTIMATED)
CASE NUMBER: 2022076002395
HX AFN AMER GLOMERULAR FILTRATION RATE: >90
HX NON-AFN AMER GLOMERULAR FILTRATION RATE: >90

## 2020-05-04 LAB — HX TSH
CASE NUMBER: 2022076002395
HX 3RD GEN TSH: 0.791 u[IU]/mL — NL (ref 0.358–3.74)

## 2020-06-09 ENCOUNTER — Other Ambulatory Visit (HOSPITAL_BASED_OUTPATIENT_CLINIC_OR_DEPARTMENT_OTHER): Admitting: Family

## 2020-06-18 ENCOUNTER — Inpatient Hospital Stay (HOSPITAL_BASED_OUTPATIENT_CLINIC_OR_DEPARTMENT_OTHER): Admit: 2020-06-18 | Discharge: 2020-06-18 | Disposition: A | Discharge status: Home / Self Care

## 2020-06-18 ENCOUNTER — Encounter

## 2020-07-12 ENCOUNTER — Encounter: Payer: PRIVATE HEALTH INSURANCE | Attending: Advanced Practice Midwife | Primary: Internal Medicine

## 2020-07-13 ENCOUNTER — Other Ambulatory Visit (HOSPITAL_BASED_OUTPATIENT_CLINIC_OR_DEPARTMENT_OTHER): Admitting: Family

## 2020-07-13 ENCOUNTER — Ambulatory Visit
Admit: 2020-07-13 | Discharge: 2020-07-13 | Payer: PRIVATE HEALTH INSURANCE | Attending: Family | Primary: Internal Medicine

## 2020-07-13 ENCOUNTER — Other Ambulatory Visit

## 2020-07-13 ENCOUNTER — Encounter (INDEPENDENT_AMBULATORY_CARE_PROVIDER_SITE_OTHER): Admitting: Family

## 2020-07-13 ENCOUNTER — Encounter (INDEPENDENT_AMBULATORY_CARE_PROVIDER_SITE_OTHER): Admitting: Internal Medicine

## 2020-07-13 ENCOUNTER — Telehealth (INDEPENDENT_AMBULATORY_CARE_PROVIDER_SITE_OTHER)

## 2020-07-13 ENCOUNTER — Other Ambulatory Visit: Admit: 2020-07-13 | Payer: PRIVATE HEALTH INSURANCE | Primary: Internal Medicine

## 2020-07-13 VITALS — BP 106/69 | Temp 97.4°F | Ht 63.0 in | Wt 149.0 lb

## 2020-07-13 DIAGNOSIS — O219 Vomiting of pregnancy, unspecified: Secondary | ICD-10-CM

## 2020-07-13 DIAGNOSIS — E0789 Other specified disorders of thyroid: Secondary | ICD-10-CM

## 2020-07-13 LAB — TSH WITH REFLEX: TSH: 0.374 u[IU]/mL (ref 0.358–3.740)

## 2020-07-13 MED ORDER — metoclopramide (Reglan) 10 mg tablet
10 | ORAL_TABLET | Freq: Four times a day (QID) | ORAL | 0 refills | 18.50000 days | Status: DC | PRN
Start: 2020-07-13 — End: 2021-02-26

## 2020-07-13 NOTE — Telephone Encounter (Signed)
-----   Message from [place] Johnpier sent at 07/13/2020 10:39 AM EDT -----  Regarding: CAN SOMEONE PLEASE PUT IN PRENATAL U/S ORDER FOR THIS PT  CAN SOMEONE PLEASE PUT IN PRENATAL U/S ORDER IN FOR PT SO I CAN BOOK ?   THANKS

## 2020-07-13 NOTE — Progress Notes (Signed)
Subjective   Sophia Brewer is a 30 y.o. G5P2 female who to establish with the practice in early pregnancy due to nausea and vomiting.   She is scheduled for her initial OB appointment in 2 weeks.  LMP 06/02/20, regular cycles and no bleeding since.   Not actively trying but they were planning to start trying to conceive in near future so pregnancy is welcomed.  She took a positive pregnancy test with her missed period 2 weeks ago and has been nauseas ever since. It started to become very bothersome last week. If she eats small, regular meals it is better. She is able to keep fluids down. However she has a hard time functioning at work and is vomiting throughout the day.  Works as a Insurance account manager.    Pregnancy history:  2008 TAB  2013 FT NSVD, [facility], no complications  2016 FT NSVD, del in [place], M, no complications  2017 molar pregnancy   2019 TAB    Menstrual History:  OB History     Gravida   5    Para   2    Term   2    Preterm        AB   3    Living   2       SAB   2    IAB        Ectopic        Multiple        Live Births   2                No LMP recorded.         Objective   BP 106/69 (BP Location: Left arm, Patient Position: Sitting, BP Cuff Size: Adult)   Temp 36.3 ?C (97.4 ?F) (Temporal)   Ht 1.6 m   Wt 67.6 kg   BMI 26.39 kg/m?     General:   alert and oriented, in no acute distress     Assessment/Plan   Reviewed safe medications in pregnancy. Currently on Omeprazole and recommended changing to Pepcid.  Discussed management of nausea and vomiting in pregnancy including small, frequent meals, snack at bedside, ginger products and can try Unisom +B6. Pt declines Unisom and wants to try a prescription to help get her through her work day. Will send Reglan and reviewed possible SE to call for.   Has NOB with dating [place] in 2 weeks. Will push up viability [place] to next avail (tomorrow) due to h/o molar pregnancy.

## 2020-07-14 ENCOUNTER — Inpatient Hospital Stay: Admit: 2020-07-14 | Discharge: 2020-07-14 | Payer: PRIVATE HEALTH INSURANCE | Primary: Internal Medicine

## 2020-07-14 DIAGNOSIS — Z8759 Personal history of other complications of pregnancy, childbirth and the puerperium: Secondary | ICD-10-CM

## 2020-07-18 ENCOUNTER — Telehealth (INDEPENDENT_AMBULATORY_CARE_PROVIDER_SITE_OTHER): Admitting: Family

## 2020-07-18 NOTE — Telephone Encounter (Signed)
TC of patient of normal [place], SIUP 7.0wk, EDC 03/02/20.

## 2020-08-01 ENCOUNTER — Ambulatory Visit: Attending: Advanced Practice Midwife | Admitting: Advanced Practice Midwife

## 2020-08-01 ENCOUNTER — Ambulatory Visit: Payer: PRIVATE HEALTH INSURANCE | Primary: Internal Medicine

## 2020-08-01 ENCOUNTER — Encounter (INDEPENDENT_AMBULATORY_CARE_PROVIDER_SITE_OTHER): Admitting: Advanced Practice Midwife

## 2020-08-01 ENCOUNTER — Other Ambulatory Visit
Admit: 2020-08-01 | Discharge: 2020-08-01 | Payer: PRIVATE HEALTH INSURANCE | Attending: Advanced Practice Midwife | Primary: Internal Medicine

## 2020-08-01 ENCOUNTER — Other Ambulatory Visit

## 2020-08-01 ENCOUNTER — Ambulatory Visit (HOSPITAL_BASED_OUTPATIENT_CLINIC_OR_DEPARTMENT_OTHER): Admitting: Advanced Practice Midwife

## 2020-08-01 ENCOUNTER — Other Ambulatory Visit: Admit: 2020-08-01 | Payer: PRIVATE HEALTH INSURANCE | Primary: Internal Medicine

## 2020-08-01 VITALS — BP 126/78 | Ht 63.0 in | Wt 154.0 lb

## 2020-08-01 DIAGNOSIS — Z3200 Encounter for pregnancy test, result unknown: Secondary | ICD-10-CM

## 2020-08-01 LAB — TYPE AND SCREEN
ABORh: A POS
Antibody Screen: NEGATIVE

## 2020-08-01 LAB — HEPATITIS B SURFACE ANTIGEN: Hepatitis B surface antigen: NONREACTIVE

## 2020-08-01 LAB — HEPATITIS C ANTIBODY: Hepatitis C antibody: NONREACTIVE

## 2020-08-01 LAB — RUBELLA ANTIBODY, IGG: Rubella IgG Quant: >500 [IU]/mL
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
## 2020-08-02 LAB — CBC WITH DIFFERENTIAL
Basophils %: 0.3 %
Basophils Absolute: 0.02 10*3/uL (ref 0.00–0.22)
Eosinophils %: 1 %
Eosinophils Absolute: 0.08 10*3/uL (ref 0.00–0.50)
Hematocrit: 34.4 % (ref 32.0–47.0)
Hemoglobin: 11.3 g/dL (ref 11.0–16.0)
Immature Granulocytes %: 0.4 %
Immature Granulocytes Absolute: 0.03 10*3/uL (ref 0.00–0.10)
Lymphocyte %: 34.9 %
Lymphocytes Absolute: 2.77 10*3/uL (ref 0.70–4.00)
MCH: 27.6 pg (ref 26.0–34.0)
MCHC: 32.8 g/dL (ref 31.0–37.0)
MCV: 84.1 fL (ref 80.0–100.0)
MPV: 11.1 fL (ref 9.1–12.4)
Monocytes %: 6.3 %
Monocytes Absolute: 0.5 10*3/uL (ref 0.36–0.77)
NRBC %: 0 % (ref 0.0–0.0)
NRBC Absolute: 0 10*3/uL (ref 0.00–2.00)
Neutrophil %: 57.1 %
Neutrophils Absolute: 4.53 10*3/uL (ref 1.50–7.95)
Platelets: 216 10*3/uL (ref 150–400)
RBC: 4.09 M/uL (ref 3.70–5.20)
RDW-CV: 13.2 % (ref 11.5–14.5)
RDW-SD: 40.5 fL (ref 35.0–51.0)
WBC: 7.9 10*3/uL (ref 4.0–11.0)

## 2020-08-02 LAB — GC/CT (AMP DNA)
CT DNA: NOT DETECTED
GC DNA: NOT DETECTED

## 2020-08-02 LAB — RPR QUALITATIVE: RPR, Qualitative: NONREACTIVE

## 2020-08-02 LAB — HEMOGLOBIN A1C
Estimated Average Glucose mg/dL (INT/EXT): 117 mg/dL
HEMOGLOBIN A1C % (INT/EXT): 5.7 % — ABNORMAL HIGH (ref <5.6)

## 2020-08-02 LAB — URINE CULTURE: Urine Culture: NO GROWTH

## 2020-08-03 LAB — HIV-1 RNA, QUANTITATIVE, RT-PCR: HIV-1 RNA Viral Load, Interp: NOT DETECTED

## 2020-08-07 LAB — HEMOGLOBIN EVALUATION
Hemoglobin A: 97.4 % (ref >96.0)
Hemoglobin F: <1 % (ref <2.0)

## 2020-08-08 LAB — HEMOGLOBIN EVALUATION
Hematocrit: 34.5 % — ABNORMAL LOW (ref 35.0–45.0)
Hemoglobin A2 (Quant): 2.6 % (ref 2.2–3.2)
Hemoglobin: 11.6 g/dL — ABNORMAL LOW (ref 11.7–15.5)
MCH: 28 pg (ref 27.0–33.0)
MCV: 83.1 fL (ref 80.0–100.0)
RDW: 13.7 % (ref 11.0–15.0)
Red Blood Cell Count: 4.15 10*6/uL (ref 3.80–5.10)

## 2020-08-09 ENCOUNTER — Encounter (INDEPENDENT_AMBULATORY_CARE_PROVIDER_SITE_OTHER): Admitting: Advanced Practice Midwife

## 2020-08-09 LAB — PAP SMEAR: Interpretation: NEGATIVE

## 2020-08-22 ENCOUNTER — Other Ambulatory Visit

## 2020-08-22 ENCOUNTER — Encounter (INDEPENDENT_AMBULATORY_CARE_PROVIDER_SITE_OTHER): Admitting: Obstetrics & Gynecology

## 2020-08-22 ENCOUNTER — Encounter: Payer: PRIVATE HEALTH INSURANCE | Attending: Obstetrics & Gynecology | Primary: Internal Medicine

## 2020-08-22 ENCOUNTER — Ambulatory Visit
Admit: 2020-08-22 | Discharge: 2020-08-22 | Payer: PRIVATE HEALTH INSURANCE | Attending: Obstetrics & Gynecology | Primary: Internal Medicine

## 2020-08-22 VITALS — BP 120/64 | Temp 97.8°F | Wt 151.0 lb

## 2020-08-22 DIAGNOSIS — Z3480 Encounter for supervision of other normal pregnancy, unspecified trimester: Secondary | ICD-10-CM

## 2020-08-22 NOTE — Progress Notes (Signed)
 BP 120/64 (BP Location: Left arm, Patient Position: Sitting, BP Cuff Size: Adult)   Temp 36.6 ?C (97.8 ?F) (Tympanic)   Wt 68.5 kg   BMI 26.75 kg/m?   Pt reports some HA and feels tired.  TSH was normal, recommend f/u with PCP prn for thyroid evaluation: Pt reports w/u negative before.  IOB labs normal but HgA1C was 5.7, plan early GCT  Fetal survey ordered.  Declination signed for NIPT/Carrier screen.  4 wk f/u

## 2020-09-01 ENCOUNTER — Telehealth (INDEPENDENT_AMBULATORY_CARE_PROVIDER_SITE_OTHER)

## 2020-09-01 NOTE — Telephone Encounter (Signed)
 Reports migraine H/A, took tylenol 325mg  w/o relief, denies hx migraines before peg. Educated re: hydration, cl flds, comfort measures & tylenol es ii tabs w/4oz pepsi q 6hrs, PRN; verbalizes understanding of teaching. LW, RN

## 2020-09-25 ENCOUNTER — Encounter: Payer: PRIVATE HEALTH INSURANCE | Attending: Obstetrics & Gynecology | Primary: Internal Medicine

## 2020-09-26 ENCOUNTER — Ambulatory Visit
Admit: 2020-09-26 | Discharge: 2020-09-26 | Payer: PRIVATE HEALTH INSURANCE | Attending: Obstetrics & Gynecology | Primary: Internal Medicine

## 2020-09-26 ENCOUNTER — Encounter: Payer: PRIVATE HEALTH INSURANCE | Attending: Advanced Practice Midwife | Primary: Internal Medicine

## 2020-09-26 ENCOUNTER — Inpatient Hospital Stay: Payer: PRIVATE HEALTH INSURANCE | Primary: Internal Medicine

## 2020-09-26 ENCOUNTER — Other Ambulatory Visit

## 2020-09-26 ENCOUNTER — Encounter (INDEPENDENT_AMBULATORY_CARE_PROVIDER_SITE_OTHER): Admitting: Obstetrics & Gynecology

## 2020-09-26 ENCOUNTER — Ambulatory Visit: Payer: PRIVATE HEALTH INSURANCE | Primary: Internal Medicine

## 2020-09-26 VITALS — BP 118/70 | Wt 157.8 lb

## 2020-09-26 DIAGNOSIS — Z3480 Encounter for supervision of other normal pregnancy, unspecified trimester: Secondary | ICD-10-CM

## 2020-09-26 NOTE — Progress Notes (Signed)
 30 y.o. L2G4010 [redacted]w[redacted]d for PNV. BP 118/70   Wt 71.6 kg   BMI 27.95 kg/m TWG 1.724 kg  Here w her 2 children, doing well no VB or pain  Was booked for survey today - aware too early and will move to 19wk. Will do early glucola at that time (A1C 5.7)   Discussed carrier screening/NIPT/QUAD screening, all reviewed again today and declined.   Follow up at 19wk for fetal survey/early glucola. RS

## 2020-10-09 ENCOUNTER — Telehealth (INDEPENDENT_AMBULATORY_CARE_PROVIDER_SITE_OTHER)

## 2020-10-09 NOTE — Telephone Encounter (Signed)
 Reports "chest feels very heavy & SOB." BP 123/82, HR 109; advised to go to ER for eval; verbalizes understanding of teaching. LW, RN

## 2020-10-10 ENCOUNTER — Telehealth (INDEPENDENT_AMBULATORY_CARE_PROVIDER_SITE_OTHER)

## 2020-10-10 NOTE — Telephone Encounter (Addendum)
 Returning patient's call in regards to c/o Palpitations, heaviness in chest, migraines and severe cramping in legs this morning.     Patient states that she called this office yesterday and was advised to go to the ER. Patient states she went to the ER and waited 10 hours and left without being seen at [facility].    Advised patient to go to the ER or call 911 if she feels like she can not operate a vehicle.     Patient states that she is not going back to the ER and will go to the urgent care instead. Advised again to patient that she should go to the ER.

## 2020-10-10 NOTE — Telephone Encounter (Signed)
 Pt called back stating the ED called her to inform her that her Hgb and Hct were low.  RN rec going back to ED to have them evaluate her as I was worried about her.  PT states she was on her way.  ALW RN

## 2020-10-12 ENCOUNTER — Encounter

## 2020-10-12 ENCOUNTER — Institutional Professional Consult (permissible substitution): Admit: 2020-10-12 | Discharge: 2020-10-12 | Payer: PRIVATE HEALTH INSURANCE | Primary: Internal Medicine

## 2020-10-12 ENCOUNTER — Inpatient Hospital Stay: Admit: 2020-10-12 | Discharge: 2020-10-12 | Payer: PRIVATE HEALTH INSURANCE | Primary: Internal Medicine

## 2020-10-12 ENCOUNTER — Other Ambulatory Visit

## 2020-10-12 DIAGNOSIS — Z331 Pregnant state, incidental: Secondary | ICD-10-CM

## 2020-10-12 DIAGNOSIS — Z3480 Encounter for supervision of other normal pregnancy, unspecified trimester: Secondary | ICD-10-CM

## 2020-10-12 LAB — POCT GLUCOSE: Glucose Blood, POC: 132 mg/dL (ref 70–139)

## 2020-10-12 NOTE — Progress Notes (Signed)
 Pt here for early GTT.  Failed @ 132.  Instructions given for 3 hr.  Orders placed.

## 2020-10-25 ENCOUNTER — Ambulatory Visit
Admit: 2020-10-25 | Discharge: 2020-10-25 | Payer: PRIVATE HEALTH INSURANCE | Attending: Family | Primary: Internal Medicine

## 2020-10-25 ENCOUNTER — Encounter (INDEPENDENT_AMBULATORY_CARE_PROVIDER_SITE_OTHER): Admitting: Family

## 2020-10-25 ENCOUNTER — Ambulatory Visit: Attending: Family | Admitting: Family

## 2020-10-25 ENCOUNTER — Other Ambulatory Visit

## 2020-10-25 VITALS — BP 118/70 | Wt 170.8 lb

## 2020-10-25 DIAGNOSIS — Z331 Pregnant state, incidental: Secondary | ICD-10-CM

## 2020-10-25 NOTE — Progress Notes (Signed)
 Normal survey at 19wk. Failed early GTT a couple weeks ago and has not yet gone for 3hr. Urged importance. She will go Monday when she has the day off.  [place] a lot for work and went to ED in [place] 2 weeks ago for dizziness/SOB. Was treated for a UTI. Pt only took two days of antibiotics and stopped because she forgot meds when traveling. Never had UTI symptoms. Will collect UC today to ensure no longer present. She developed thick vaginal discharge and itching last week. Prone to yeast with antibiotics. However it just resolved a couple days ago. Exam with no lesions/rash on vulva, normal appearing scant d/c. Wet prep no clue or yeast seen. 3 wk KM

## 2020-10-27 ENCOUNTER — Encounter (INDEPENDENT_AMBULATORY_CARE_PROVIDER_SITE_OTHER): Admitting: Family

## 2020-10-28 LAB — URINE CULTURE
Urine Culture: 1000 — AB
Urine Culture: >100000

## 2020-10-30 ENCOUNTER — Other Ambulatory Visit: Admit: 2020-10-30 | Payer: PRIVATE HEALTH INSURANCE | Primary: Internal Medicine

## 2020-10-30 ENCOUNTER — Encounter (INDEPENDENT_AMBULATORY_CARE_PROVIDER_SITE_OTHER): Admitting: Internal Medicine

## 2020-10-30 ENCOUNTER — Other Ambulatory Visit

## 2020-10-30 ENCOUNTER — Telehealth (INDEPENDENT_AMBULATORY_CARE_PROVIDER_SITE_OTHER): Admitting: Family

## 2020-10-30 DIAGNOSIS — Z331 Pregnant state, incidental: Secondary | ICD-10-CM

## 2020-10-30 LAB — GLUCOSE TOLERANCE PRENATAL 3 HOUR: Glucose, 3 hour: 58 mg/dL — ABNORMAL LOW (ref 70–139)

## 2020-10-30 LAB — GLUCOSE TOLERANCE PRENATAL 2 HOUR: Glucose, 2 hour: 82 mg/dL (ref 70–154)

## 2020-10-30 LAB — GLUCOSE TOLERANCE PRENATAL 1 HOUR: Glucose, 1 hour: 102 mg/dL (ref 70–179)

## 2020-10-30 LAB — GLUCOSE TOLERANCE PRENATAL FASTING: Glucose, fasting: 75 mg/dL (ref 70–94)

## 2020-10-30 MED ORDER — cephalexin (Keflex) 500 mg capsule
500 | ORAL_CAPSULE | Freq: Two times a day (BID) | ORAL | 0 refills | 8.50000 days | Status: AC
Start: 2020-10-30 — End: 2020-11-06

## 2020-10-30 NOTE — Telephone Encounter (Signed)
 Can you let her know she has a mild UTI and I sent antibiotics in to her pharmacy for her.  The culture initially showed just a little GBS so treatment was not needed (I had LMOM for this) however the final result was changed.  Thanks KM

## 2020-10-31 ENCOUNTER — Telehealth (INDEPENDENT_AMBULATORY_CARE_PROVIDER_SITE_OTHER)

## 2020-10-31 NOTE — Telephone Encounter (Signed)
 Called patient and informed patient that she passed 3 hr gtt. patient appreciative of the call.

## 2020-10-31 NOTE — Telephone Encounter (Signed)
-----   Message from Janey Greaser, NP sent at 10/31/2020  8:52 AM EDT -----  Please inform her of passed 3hr. thanks  ----- Message -----  From: Lab, Background User  Sent: 10/30/2020   2:04 PM EDT  To: Janey Greaser, NP

## 2020-11-08 ENCOUNTER — Ambulatory Visit
Admit: 2020-11-08 | Discharge: 2020-11-08 | Payer: PRIVATE HEALTH INSURANCE | Attending: Advanced Practice Midwife | Primary: Internal Medicine

## 2020-11-08 ENCOUNTER — Other Ambulatory Visit

## 2020-11-08 ENCOUNTER — Encounter

## 2020-11-08 ENCOUNTER — Encounter (INDEPENDENT_AMBULATORY_CARE_PROVIDER_SITE_OTHER): Admitting: Advanced Practice Midwife

## 2020-11-08 VITALS — BP 106/56 | Wt 175.4 lb

## 2020-11-08 DIAGNOSIS — Z331 Pregnant state, incidental: Secondary | ICD-10-CM

## 2020-11-08 NOTE — Progress Notes (Signed)
 30 g6P2 here at [redacted]w[redacted]d for routine PNV. Currently on Keflex for UTI. States told by office to take. Last IC 1000 K GBS. Rev GBS and risks UTI/Pyrlo/PTB in pregnancy and need for treatment in labor. Will finish antibx in next 2 days and feels no sx. Pos FM. No sx PTL. Had 3 hr GTT ( passed early) needs repeat at 26-28 weeks. No sx yeast. Rev second tri self care and warnings. RTC 3w

## 2020-11-10 ENCOUNTER — Encounter: Payer: PRIVATE HEALTH INSURANCE | Attending: Obstetrics & Gynecology | Primary: Internal Medicine

## 2020-12-08 ENCOUNTER — Encounter: Payer: PRIVATE HEALTH INSURANCE | Attending: Obstetrics & Gynecology | Primary: Internal Medicine

## 2020-12-08 ENCOUNTER — Encounter (INDEPENDENT_AMBULATORY_CARE_PROVIDER_SITE_OTHER): Admitting: Family

## 2020-12-08 ENCOUNTER — Other Ambulatory Visit

## 2020-12-08 ENCOUNTER — Ambulatory Visit
Admit: 2020-12-08 | Discharge: 2020-12-08 | Payer: PRIVATE HEALTH INSURANCE | Attending: Family | Primary: Internal Medicine

## 2020-12-08 ENCOUNTER — Ambulatory Visit: Attending: Family | Admitting: Family

## 2020-12-08 VITALS — BP 108/64 | Wt 182.8 lb

## 2020-12-08 DIAGNOSIS — Z3A28 28 weeks gestation of pregnancy: Secondary | ICD-10-CM

## 2020-12-08 NOTE — Progress Notes (Signed)
 Was given glucola today, failed.  3 Hr GTT ordered w/ 28 wk labs.  Is A(+).  TOC urine sent. Mood stabile, EPDS 0.  Discussed carseat.  Having a boy, plans circ.  Consented for tdap, agrees to flu but wants to get another day.  Declines COVID booster despite recommendations.  Working, travels to [employer].  Questions answered.  Checklist 2 wks.

## 2020-12-12 LAB — URINE CULTURE
Urine Culture: 10000 — AB
Urine Culture: >100000

## 2020-12-13 ENCOUNTER — Encounter (INDEPENDENT_AMBULATORY_CARE_PROVIDER_SITE_OTHER): Admitting: Family

## 2020-12-13 ENCOUNTER — Telehealth (INDEPENDENT_AMBULATORY_CARE_PROVIDER_SITE_OTHER)

## 2020-12-13 NOTE — Telephone Encounter (Signed)
 Reports constant weakness, LH, dizzy all day; only relief is to sit or lay down, finding work difficult, works 12hr shifts on her feet, afraid she'll pass out d/t dizziness. Offered appt, declines d/t works in [place] M-F, home in area on w/e; educated re: hydration, eating sm meals throughout the day, monitor s/s to seek med eval; verbalizes understanding of teaching. LW, RN

## 2020-12-21 ENCOUNTER — Ambulatory Visit
Admit: 2020-12-21 | Discharge: 2020-12-21 | Payer: PRIVATE HEALTH INSURANCE | Attending: Specialist | Primary: Internal Medicine

## 2020-12-21 ENCOUNTER — Encounter
Payer: PRIVATE HEALTH INSURANCE | Attending: Student in an Organized Health Care Education/Training Program | Primary: Internal Medicine

## 2020-12-21 ENCOUNTER — Other Ambulatory Visit

## 2020-12-21 ENCOUNTER — Encounter (INDEPENDENT_AMBULATORY_CARE_PROVIDER_SITE_OTHER): Admitting: Specialist

## 2020-12-21 VITALS — BP 108/54 | Wt 182.0 lb

## 2020-12-21 DIAGNOSIS — Z3A29 29 weeks gestation of pregnancy: Secondary | ICD-10-CM

## 2020-12-21 NOTE — Progress Notes (Signed)
 Pt is a 30 y.o. [id number] at [redacted]w[redacted]d who presents for prenatal visit. Good FM, no bleeding, no LOF, no ctx.   30w checklist done.  Repeat 3h GTT and 28w labs not done yet, states she didn't know she needed them.  Will go this week. Unsure about TDaP and Flu shot, explained why we recommend, states she will decide next visit.

## 2020-12-22 ENCOUNTER — Other Ambulatory Visit: Admit: 2020-12-22 | Payer: PRIVATE HEALTH INSURANCE | Primary: Internal Medicine

## 2020-12-22 ENCOUNTER — Encounter (INDEPENDENT_AMBULATORY_CARE_PROVIDER_SITE_OTHER): Admitting: Family

## 2020-12-22 DIAGNOSIS — Z3A28 28 weeks gestation of pregnancy: Secondary | ICD-10-CM

## 2020-12-22 LAB — CBC
Hematocrit: 29.9 % — ABNORMAL LOW (ref 32.0–47.0)
Hemoglobin: 9.8 g/dL — ABNORMAL LOW (ref 11.0–16.0)
MCH: 26.8 pg (ref 26.0–34.0)
MCHC: 32.8 g/dL (ref 31.0–37.0)
MCV: 81.9 fL (ref 80.0–100.0)
MPV: 10.9 fL (ref 9.1–12.4)
NRBC %: 0 % (ref 0.0–0.0)
NRBC Absolute: 0 10*3/uL (ref 0.00–2.00)
Platelets: 182 10*3/uL (ref 150–400)
RBC: 3.65 M/uL — ABNORMAL LOW (ref 3.70–5.20)
RDW-CV: 12.6 % (ref 11.5–14.5)
RDW-SD: 37.3 fL (ref 35.0–51.0)
WBC: 8.2 10*3/uL (ref 4.0–11.0)

## 2020-12-22 LAB — GLUCOSE TOLERANCE PRENATAL FASTING: Glucose, fasting: 75 mg/dL (ref 70–94)

## 2020-12-22 LAB — GLUCOSE TOLERANCE PRENATAL 2 HOUR: Glucose, 2 hour: 91 mg/dL (ref 70–154)

## 2020-12-22 LAB — TREPONEMA PALLIDUM (SYPHILIS WITH RPR  TITER) AB S: Treponema Pallidum Ab Screen: NONREACTIVE

## 2020-12-22 LAB — GLUCOSE TOLERANCE PRENATAL 1 HOUR: Glucose, 1 hour: 101 mg/dL (ref 70–179)

## 2020-12-22 LAB — HEPATITIS C ANTIBODY: Hepatitis C antibody: NONREACTIVE

## 2020-12-22 LAB — HIV AB/AG SCREEN: HIV-1/2 Ab/Ag, P24 Ag screen: NONREACTIVE

## 2020-12-22 LAB — TYPE AND SCREEN
ABORh: A POS
Antibody Screen: NEGATIVE

## 2020-12-22 LAB — GLUCOSE TOLERANCE PRENATAL 3 HOUR: Glucose, 3 hour: 87 mg/dL (ref 70–139)

## 2020-12-25 ENCOUNTER — Encounter
Payer: PRIVATE HEALTH INSURANCE | Attending: Student in an Organized Health Care Education/Training Program | Primary: Internal Medicine

## 2021-01-05 ENCOUNTER — Other Ambulatory Visit

## 2021-01-05 ENCOUNTER — Ambulatory Visit
Admit: 2021-01-05 | Discharge: 2021-01-05 | Payer: PRIVATE HEALTH INSURANCE | Attending: Advanced Practice Midwife | Primary: Internal Medicine

## 2021-01-05 ENCOUNTER — Encounter (INDEPENDENT_AMBULATORY_CARE_PROVIDER_SITE_OTHER): Admitting: Advanced Practice Midwife

## 2021-01-05 VITALS — BP 126/70 | Wt 184.2 lb

## 2021-01-05 DIAGNOSIS — Z3A32 32 weeks gestation of pregnancy: Secondary | ICD-10-CM

## 2021-01-05 MED ORDER — ferrous sulfate 325 (65 Fe) MG EC tablet
325 | ORAL_TABLET | ORAL | 3 refills | Status: AC
Start: 2021-01-05 — End: 2021-02-04

## 2021-01-05 NOTE — Progress Notes (Signed)
 30 yo [id number] @32weeks  doing well. +Fm/ denies VB/LOF/ctx.  Completed 28 wk labs. 11/4- H/H 9.8 and 29.9- advised Fe supplement- Declines flu vaccine. No covid booster- reviewed recommendation for bivalent booster.  F/U 2 weeks

## 2021-01-18 ENCOUNTER — Encounter (INDEPENDENT_AMBULATORY_CARE_PROVIDER_SITE_OTHER): Admitting: Advanced Practice Midwife

## 2021-01-18 ENCOUNTER — Ambulatory Visit
Admit: 2021-01-18 | Discharge: 2021-01-18 | Payer: PRIVATE HEALTH INSURANCE | Attending: Advanced Practice Midwife | Primary: Internal Medicine

## 2021-01-18 ENCOUNTER — Other Ambulatory Visit

## 2021-01-18 VITALS — BP 124/82 | Wt 187.4 lb

## 2021-01-18 DIAGNOSIS — Z331 Pregnant state, incidental: Secondary | ICD-10-CM

## 2021-01-18 NOTE — Progress Notes (Signed)
 Blood pressure 124/82, weight 85 kg, last menstrual period 05/26/2020, not currently breastfeeding.  30 yo G6p2 here at [redacted]w[redacted]d for routine PNV. Pos FM. No sx PTL. B/p WNL. Taking ferrous supps QOD for anemia x 1 month. Will recheck CBC/ferritin. BSUS for position only shows vertex. Measures slightly larger than dates. Will follow. Long discussion re/; birth plan- wants delayed cord clamping ( reviewed ok if infant and mom well/no distress), wants to take placenta home. Reviewed that encapsulating is not recommended and if planting must own property and follow strict local codes. Pt aware. Having some LBP. Not impairing ambulation. Declines support belt. Will try stretching and ice. Aware can ask for PT eval if desires. May try chiro through insurance. Rev GBS in urine early preg and 3rd tr self care/warnings. RTC 2w

## 2021-01-25 ENCOUNTER — Telehealth (INDEPENDENT_AMBULATORY_CARE_PROVIDER_SITE_OTHER)

## 2021-01-25 NOTE — Telephone Encounter (Signed)
 Pt called with concern of arm pain. States pain started appx 2 wks ago. Reports started in wrist and goes up her arm. Pain is worst when laying on side. Massage helps. Pt also reporting LBP and sciatica. Discussed with pt that pains may be related to pregnancy. Encouraged to consult with chiropractor as discussed at last visit. Also advised tylenol, heat, ice, and stretching. Advised ED with any extreme pain. Pt verbs understanding. -JB

## 2021-02-01 ENCOUNTER — Ambulatory Visit
Admit: 2021-02-01 | Discharge: 2021-02-01 | Payer: PRIVATE HEALTH INSURANCE | Attending: Obstetrics & Gynecology | Primary: Internal Medicine

## 2021-02-01 ENCOUNTER — Other Ambulatory Visit

## 2021-02-01 VITALS — BP 122/78 | Wt 188.4 lb

## 2021-02-01 DIAGNOSIS — Z331 Pregnant state, incidental: Secondary | ICD-10-CM

## 2021-02-01 NOTE — Progress Notes (Signed)
 PNV Late but accomodated Here with FOB Good Fm No ctx + GBS urine so aware tx in labor Did not go for CBC PNV wkly MM

## 2021-02-08 ENCOUNTER — Encounter (HOSPITAL_BASED_OUTPATIENT_CLINIC_OR_DEPARTMENT_OTHER)

## 2021-02-08 ENCOUNTER — Ambulatory Visit
Admit: 2021-02-08 | Discharge: 2021-02-08 | Payer: PRIVATE HEALTH INSURANCE | Attending: Advanced Practice Midwife | Primary: Internal Medicine

## 2021-02-08 ENCOUNTER — Other Ambulatory Visit

## 2021-02-08 ENCOUNTER — Other Ambulatory Visit: Admit: 2021-02-08 | Payer: PRIVATE HEALTH INSURANCE | Primary: Internal Medicine

## 2021-02-08 ENCOUNTER — Encounter (INDEPENDENT_AMBULATORY_CARE_PROVIDER_SITE_OTHER): Admitting: Advanced Practice Midwife

## 2021-02-08 ENCOUNTER — Encounter

## 2021-02-08 VITALS — BP 122/70 | Wt 188.4 lb

## 2021-02-08 DIAGNOSIS — Z331 Pregnant state, incidental: Secondary | ICD-10-CM

## 2021-02-08 DIAGNOSIS — O9903 Anemia complicating the puerperium: Secondary | ICD-10-CM

## 2021-02-08 LAB — CBC WITH DIFFERENTIAL
Basophils %: 0.3 %
Basophils Absolute: 0.02 10*3/uL (ref 0.00–0.22)
Eosinophils %: 1.9 %
Eosinophils Absolute: 0.15 10*3/uL (ref 0.00–0.50)
Hematocrit: 29.8 % — ABNORMAL LOW (ref 32.0–47.0)
Hemoglobin: 9.4 g/dL — ABNORMAL LOW (ref 11.0–16.0)
Immature Granulocytes %: 1.5 %
Immature Granulocytes Absolute: 0.12 10*3/uL — ABNORMAL HIGH (ref 0.00–0.10)
Lymphocyte %: 28.1 %
Lymphocytes Absolute: 2.23 10*3/uL (ref 0.70–4.00)
MCH: 24.5 pg — ABNORMAL LOW (ref 26.0–34.0)
MCHC: 31.5 g/dL (ref 31.0–37.0)
MCV: 77.8 fL — ABNORMAL LOW (ref 80.0–100.0)
MPV: 10.2 fL (ref 9.1–12.4)
Monocytes %: 10.8 %
Monocytes Absolute: 0.86 10*3/uL — ABNORMAL HIGH (ref 0.36–0.77)
NRBC %: 0 % (ref 0.0–0.0)
NRBC Absolute: 0 10*3/uL (ref 0.00–2.00)
Neutrophil %: 57.4 %
Neutrophils Absolute: 4.57 10*3/uL (ref 1.50–7.95)
Platelets: 201 10*3/uL (ref 150–400)
RBC: 3.83 M/uL (ref 3.70–5.20)
RDW-CV: 13.8 % (ref 11.5–14.5)
RDW-SD: 39 fL (ref 35.0–51.0)
WBC: 8 10*3/uL (ref 4.0–11.0)

## 2021-02-08 LAB — FERRITIN: Ferritin: 7.7 ng/mL — ABNORMAL LOW (ref 10.0–307.0)

## 2021-02-08 NOTE — Progress Notes (Addendum)
 BP 122/70   Wt 85.5 kg   LMP 05/26/2020 (Exact Date)   BMI 33.37 kg/m    30 yo G6P2 here at [redacted]w[redacted]d for routine PNV. Pos FM. No sx PTL. B/p wnl. States has itching on legs/feet. Has had itching and tried lotion/changed laundry detergent. Will send bile salts. Pt to try Claritan OTC for itching. Has not gotten CBC/ferritin yet. Agrees to do so with bile salts today. GBS pos in urine. Managing with LBP. Rev 3rd tri care and warnings.     Addendum: H/H 9.4 and 29.8 and Ferritin 7.7 on 02/08/21-pt will need Venofer infusions. T/C to pt house to discuss. Left message asking call back to office. Msg sent to chg RN to arrange. Therapy plan orders placed.

## 2021-02-08 NOTE — Addendum Note (Signed)
 Addended by: Sander Nephew on: 02/09/2021 11:54 AM     Modules accepted: Orders

## 2021-02-09 ENCOUNTER — Encounter (HOSPITAL_BASED_OUTPATIENT_CLINIC_OR_DEPARTMENT_OTHER): Admitting: Advanced Practice Midwife

## 2021-02-15 ENCOUNTER — Telehealth (INDEPENDENT_AMBULATORY_CARE_PROVIDER_SITE_OTHER)

## 2021-02-15 ENCOUNTER — Other Ambulatory Visit

## 2021-02-15 ENCOUNTER — Ambulatory Visit
Admit: 2021-02-15 | Discharge: 2021-02-15 | Payer: PRIVATE HEALTH INSURANCE | Attending: Advanced Practice Midwife | Primary: Internal Medicine

## 2021-02-15 VITALS — BP 124/72 | Wt 191.0 lb

## 2021-02-15 DIAGNOSIS — Z331 Pregnant state, incidental: Secondary | ICD-10-CM

## 2021-02-15 NOTE — Progress Notes (Signed)
 BP 124/72   Wt 86.6 kg   LMP 05/26/2020 (Exact Date)   BMI 33.83 kg/m    30 yo G6p2 here at 45 w 6d for routine PNV. Pos FM. No sx labor. B/P WNL. Had mil headache intermittently today. HAs not tried hydration or Acetaminophen. Rev headache risk in pregnancy and potential sx PRE E. Pt has no hx PRE E/HTN. Will go home nad take fluids and Acetaminophen and call if no improvement in 1 hour. Rev sx PRE to report ie n/vepigastric pain or visual change. HAs not heard from IV infusion center for Venofer. CH RN to call center and check into this for pt. Bile salts pending. Pruritis improved. GBS pos urine. RTC 1 w

## 2021-02-15 NOTE — Telephone Encounter (Signed)
RN lm with [facility] asking if we can get a couple of infusions prior to pt delivering.  Asking for return call.  LAW RN

## 2021-02-16 LAB — BILE ACIDS, PREGNANCY
Chenodeoxycholic Acid: 6.5 umol/L — ABNORMAL HIGH (ref <=3.9)
Cholic Acid: 5.4 umol/L — ABNORMAL HIGH (ref <=2.8)
Deoxycholic Acid: 1.2 umol/L (ref <=2.3)
Total Bile Acids: 13.2 umol/L — ABNORMAL HIGH (ref <=8.3)

## 2021-02-16 NOTE — Telephone Encounter (Signed)
 Sophia Brewer from [facility] returned call states they are booking 1 mo out, but got pt in for 1/3 @ 10 and 1/5 @8 , will call pt.  If she can not make those appt, there are no other appts.  ALWR N

## 2021-02-18 ENCOUNTER — Encounter (HOSPITAL_BASED_OUTPATIENT_CLINIC_OR_DEPARTMENT_OTHER): Admitting: Advanced Practice Midwife

## 2021-02-20 ENCOUNTER — Telehealth (INDEPENDENT_AMBULATORY_CARE_PROVIDER_SITE_OTHER)

## 2021-02-20 ENCOUNTER — Encounter

## 2021-02-20 ENCOUNTER — Other Ambulatory Visit

## 2021-02-20 ENCOUNTER — Ambulatory Visit: Admit: 2021-02-20 | Discharge: 2021-02-20 | Payer: PRIVATE HEALTH INSURANCE | Primary: Internal Medicine

## 2021-02-20 VITALS — BP 115/60 | HR 104 | Temp 97.1°F | Resp 16 | Ht 63.0 in | Wt 193.0 lb

## 2021-02-20 DIAGNOSIS — O99013 Anemia complicating pregnancy, third trimester: Secondary | ICD-10-CM

## 2021-02-20 DIAGNOSIS — Z331 Pregnant state, incidental: Secondary | ICD-10-CM

## 2021-02-20 MED ORDER — iron sucrose (Venofer) 100 mg iron/5 mL injection  - Omnicell Override Pull
100 | INTRAVENOUS | Status: AC
Start: 2021-02-20 — End: ?

## 2021-02-20 MED ORDER — iron sucrose (Venofer) injection 200 mg
100 | Freq: Once | INTRAVENOUS | Status: AC
Start: 2021-02-20 — End: 2021-02-20
  Administered 2021-02-20: 15:00:00 200 mg via INTRAVENOUS

## 2021-02-20 MED ORDER — sodium chloride 0.9 % flush 10 mL
Freq: Once | INTRAMUSCULAR | Status: DC | PRN
Start: 2021-02-20 — End: 2021-02-20

## 2021-02-20 MED ORDER — sodium chloride 0.9 % flush 10 mL
Freq: Once | INTRAMUSCULAR | Status: DC
Start: 2021-02-20 — End: 2021-02-20

## 2021-02-20 MED FILL — IRON SUCROSE 100 MG IRON/5 ML INTRAVENOUS SOLUTION: 100 100 mg iron/5 mL | INTRAVENOUS | Qty: 10

## 2021-02-20 NOTE — Telephone Encounter (Signed)
Pt calling with concerns re: elevated bile acid results. Advised will consult ordering provider. Please review and advise.

## 2021-02-20 NOTE — Progress Notes (Signed)
 Sophia Brewer came to the clinic today for venofer 200mg  ivp over .   She is due Jan 13th.  States she thought she was only having 2 infusions, but she is scheduled for 5, and her therapy plan is for 5. Her next apt is thurs Jan 5th.    Tolerated IVP over . OBserved for 30 min post.   Ferritin 7.7.

## 2021-02-21 ENCOUNTER — Encounter (HOSPITAL_BASED_OUTPATIENT_CLINIC_OR_DEPARTMENT_OTHER): Admitting: Advanced Practice Midwife

## 2021-02-21 ENCOUNTER — Encounter (HOSPITAL_BASED_OUTPATIENT_CLINIC_OR_DEPARTMENT_OTHER): Admitting: Student in an Organized Health Care Education/Training Program

## 2021-02-21 ENCOUNTER — Telehealth (INDEPENDENT_AMBULATORY_CARE_PROVIDER_SITE_OTHER): Admitting: Advanced Practice Midwife

## 2021-02-21 ENCOUNTER — Ambulatory Visit: Payer: PRIVATE HEALTH INSURANCE | Primary: Internal Medicine

## 2021-02-21 ENCOUNTER — Other Ambulatory Visit

## 2021-02-21 ENCOUNTER — Inpatient Hospital Stay
Admit: 2021-02-21 | Discharge: 2021-02-26 | Disposition: A | Discharge status: Home / Self Care | Payer: PRIVATE HEALTH INSURANCE | Source: Ambulatory Visit | Attending: Specialist | Admitting: Specialist

## 2021-02-21 DIAGNOSIS — Z3490 Encounter for supervision of normal pregnancy, unspecified, unspecified trimester: Secondary | ICD-10-CM

## 2021-02-21 DIAGNOSIS — O26619 Liver and biliary tract disorders in pregnancy, unspecified trimester: Secondary | ICD-10-CM

## 2021-02-21 DIAGNOSIS — K831 Obstruction of bile duct: Secondary | ICD-10-CM

## 2021-02-21 DIAGNOSIS — O2662 Liver and biliary tract disorders in childbirth: Principal | ICD-10-CM

## 2021-02-21 LAB — HEPATIC FUNCTION PANEL
ALT: 27 U/L (ref 0–55)
AST: 47 U/L — ABNORMAL HIGH (ref 6–42)
Albumin: 3 g/dL — ABNORMAL LOW (ref 3.2–5.0)
Alkaline phosphatase: 90 U/L (ref 30–130)
Bilirubin, direct: <0.1 mg/dL (ref 0.0–0.5)
Bilirubin, total: 0.3 mg/dL (ref 0.2–1.2)
Protein, total: 7.4 g/dL (ref 6.0–8.4)

## 2021-02-21 LAB — CBC WITH DIFFERENTIAL
Basophils %: 0.2 %
Basophils Absolute: 0.02 10*3/uL (ref 0.00–0.22)
Eosinophils %: 1.5 %
Eosinophils Absolute: 0.12 10*3/uL (ref 0.00–0.50)
Hematocrit: 31.8 % — ABNORMAL LOW (ref 32.0–47.0)
Hemoglobin: 9.9 g/dL — ABNORMAL LOW (ref 11.0–16.0)
Immature Granulocytes %: 1.7 %
Immature Granulocytes Absolute: 0.14 10*3/uL — ABNORMAL HIGH (ref 0.00–0.10)
Lymphocyte %: 26 %
Lymphocytes Absolute: 2.13 10*3/uL (ref 0.70–4.00)
MCH: 23.7 pg — ABNORMAL LOW (ref 26.0–34.0)
MCHC: 31.1 g/dL (ref 31.0–37.0)
MCV: 76.1 fL — ABNORMAL LOW (ref 80.0–100.0)
MPV: 10.8 fL (ref 9.1–12.4)
Monocytes %: 12.1 %
Monocytes Absolute: 0.99 10*3/uL — ABNORMAL HIGH (ref 0.36–0.77)
NRBC %: 0.2 % — ABNORMAL HIGH (ref 0.0–0.0)
NRBC Absolute: 0.02 10*3/uL (ref 0.00–2.00)
Neutrophil %: 58.5 %
Neutrophils Absolute: 4.79 10*3/uL (ref 1.50–7.95)
Platelets: 209 10*3/uL (ref 150–400)
RBC: 4.18 M/uL (ref 3.70–5.20)
RDW-CV: 14.5 % (ref 11.5–14.5)
RDW-SD: 39.4 fL (ref 35.0–51.0)
WBC: 8.2 10*3/uL (ref 4.0–11.0)

## 2021-02-21 LAB — SARS/FLU/RSV
Influenza A RNA: NOT DETECTED
Influenza B RNA: NOT DETECTED
Respiratory syncytial virus: NOT DETECTED
SARS-CoV-2 RNA PCR: NOT DETECTED

## 2021-02-21 LAB — TYPE AND SCREEN
ABORh: A POS
Antibody Screen: NEGATIVE

## 2021-02-21 MED ORDER — oxytocin (Pitocin) injection 10 Units
10 | Freq: Once | INTRAMUSCULAR | Status: DC | PRN
Start: 2021-02-21 — End: 2021-02-23

## 2021-02-21 MED ORDER — butorphanol (Stadol) injection 1 mg
1 | INTRAMUSCULAR | Status: DC | PRN
Start: 2021-02-21 — End: 2021-02-26

## 2021-02-21 MED ORDER — oxytocin (Pitocin) infusion in lactated Ringers 30 units/500 mL
30 | INTRAVENOUS | Status: DC
Start: 2021-02-21 — End: 2021-02-26

## 2021-02-21 MED ORDER — penicillin G potassium 5 million unit injection  - Omnicell Override Pull
5 | INTRAMUSCULAR | Status: AC
Start: 2021-02-21 — End: ?

## 2021-02-21 MED ORDER — acetaminophen (Tylenol) liquid 650 mg
160 | Freq: Four times a day (QID) | ORAL | Status: DC | PRN
Start: 2021-02-21 — End: 2021-02-23

## 2021-02-21 MED ORDER — methylergonovine (Methergine) tablet 200 mcg
0.2 | ORAL | Status: DC | PRN
Start: 2021-02-21 — End: 2021-02-23

## 2021-02-21 MED ORDER — mineral oil liquid 30 mL
Freq: Once | ORAL | Status: DC | PRN
Start: 2021-02-21 — End: 2021-02-23

## 2021-02-21 MED ORDER — phytonadione (Vitamin K) 1 mg/0.5 mL injection  - Omnicell Override Pull
1 | INTRAMUSCULAR | Status: AC
Start: 2021-02-21 — End: ?

## 2021-02-21 MED ORDER — oxytocin (Pitocin) infusion in lactated Ringers 30 units/500 mL
30 | INTRAVENOUS | Status: DC
Start: 2021-02-21 — End: 2021-02-26
  Administered 2021-02-22: 07:00:00 1 m[IU]/min via INTRAVENOUS

## 2021-02-21 MED ORDER — lidocaine PF (Xylocaine) 10 mg/mL (1 %) injection  - Omnicell Override Pull
10 | INTRAMUSCULAR | Status: AC
Start: 2021-02-21 — End: ?

## 2021-02-21 MED ORDER — carboprost (Hemabate) injection 250 mcg
250 | INTRAMUSCULAR | Status: DC | PRN
Start: 2021-02-21 — End: 2021-02-23

## 2021-02-21 MED ORDER — ondansetron ODT (Zofran-ODT) disintegrating tablet 4 mg
4 | Freq: Three times a day (TID) | ORAL | Status: DC | PRN
Start: 2021-02-21 — End: 2021-02-23

## 2021-02-21 MED ORDER — erythromycin (Romycin) 5 mg/gram (0.5 %) ophthalmic ointment  - Omnicell Override Pull
5 | OPHTHALMIC | Status: AC
Start: 2021-02-21 — End: ?

## 2021-02-21 MED ORDER — oxytocin in lactated Ringers (Pitocin) 30 unit/500 mL infusion  - Omnicell Override Pull
30 | INTRAVENOUS | Status: AC
Start: 2021-02-21 — End: ?

## 2021-02-21 MED ORDER — acetaminophen (Tylenol) suppository 650 mg
650 | Freq: Four times a day (QID) | RECTAL | Status: DC | PRN
Start: 2021-02-21 — End: 2021-02-23

## 2021-02-21 MED ORDER — lidocaine PF (Xylocaine) 10 mg/mL (1 %) injection 200 mg
10 | Freq: Once | INTRAMUSCULAR | Status: AC | PRN
Start: 2021-02-21 — End: 2021-02-22
  Administered 2021-02-22: 20:00:00 3 mL

## 2021-02-21 MED ORDER — miSOPROStoL (Cytotec) tablet 1,000 mcg
200 | Freq: Once | ORAL | Status: DC | PRN
Start: 2021-02-21 — End: 2021-02-23

## 2021-02-21 MED ORDER — acetaminophen (Tylenol) tablet 650 mg
325 | Freq: Four times a day (QID) | ORAL | Status: DC | PRN
Start: 2021-02-21 — End: 2021-02-23

## 2021-02-21 MED ORDER — ondansetron (Zofran) injection 4 mg
4 | Freq: Three times a day (TID) | INTRAMUSCULAR | Status: DC | PRN
Start: 2021-02-21 — End: 2021-02-23

## 2021-02-21 MED ORDER — miSOPROStol (Cytotec) split tablet 50 mcg
25 | ORAL | Status: DC
Start: 2021-02-21 — End: 2021-02-21
  Administered 2021-02-21 – 2021-02-22 (×2): 50 ug via ORAL

## 2021-02-21 MED ORDER — penicillin G potassium IVPB solution 2.5 Million Units
2.5 | INTRAVENOUS | Status: DC
Start: 2021-02-21 — End: 2021-02-23
  Administered 2021-02-22 (×4): 2.5 10*6.[IU] via INTRAVENOUS

## 2021-02-21 MED ORDER — lactated Ringer's infusion
INTRAVENOUS | Status: DC
Start: 2021-02-21 — End: 2021-02-26
  Administered 2021-02-22: 02:00:00 1200 mL/h via INTRAVENOUS
  Administered 2021-02-22: 17:00:00 125 mL/h via INTRAVENOUS

## 2021-02-21 MED ORDER — oxytocin (Pitocin) infusion in lactated Ringers 30 units/500 mL
30 | INTRAVENOUS | Status: DC
Start: 2021-02-21 — End: 2021-02-21

## 2021-02-21 MED ORDER — zolpidem (Ambien) tablet 5 mg
5 | Freq: Every evening | ORAL | Status: DC | PRN
Start: 2021-02-21 — End: 2021-02-26
  Administered 2021-02-22: 06:00:00 5 mg via ORAL

## 2021-02-21 MED ORDER — miSOPROStol (Cytotec) 25 MCG split tablet  - Omnicell Override Pull
25 | ORAL | Status: AC
Start: 2021-02-21 — End: ?

## 2021-02-21 MED ORDER — sodium chloride 0.9 % flush 3 mL
Freq: Three times a day (TID) | INTRAMUSCULAR | Status: DC | PRN
Start: 2021-02-21 — End: 2021-02-23

## 2021-02-21 MED ORDER — carboprost (Hemabate) injection 250 mcg
250 | Freq: Once | INTRAMUSCULAR | Status: DC | PRN
Start: 2021-02-21 — End: 2021-02-23

## 2021-02-21 MED ORDER — miSOPROStoL (Cytotec) 200 mcg tablet  - Omnicell Override Pull
200 | ORAL | Status: AC
Start: 2021-02-21 — End: ?

## 2021-02-21 MED ORDER — tranexamic acid (Cyklokapron) 1,000 mg in sodium chloride 0.9 % 100 mL IVPB- MBP
1000 | Freq: Once | INTRAVENOUS | Status: DC | PRN
Start: 2021-02-21 — End: 2021-02-23

## 2021-02-21 MED ORDER — mineral oil liquid  - Omnicell Override Pull
ORAL | Status: AC
Start: 2021-02-21 — End: ?
  Filled 2021-02-21: qty 30

## 2021-02-21 MED ORDER — penicillin G potassium 5 Million Units in sodium chloride 0.9 % 100 mL IVPB-MBP
5 | INTRAMUSCULAR | Status: AC
Start: 2021-02-21 — End: 2021-02-21
  Administered 2021-02-22: 03:00:00 5 10*6.[IU] via INTRAVENOUS

## 2021-02-21 MED ORDER — methylergonovine (Methergine) injection 200 mcg
0.2 | Freq: Once | INTRAMUSCULAR | Status: DC | PRN
Start: 2021-02-21 — End: 2021-02-23

## 2021-02-21 MED FILL — OXYTOCIN IN LACTATED RINGERS 30 UNIT/500 ML INTRAVENOUS SOLUTION: 30 30 unit/500 mL | INTRAVENOUS | Qty: 500

## 2021-02-21 MED FILL — MISOPROSTOL 25 MCG SPLIT TABS: 25 25 MCG | ORAL | Qty: 2

## 2021-02-21 MED FILL — PHYTONADIONE (VITAMIN K1) 1 MG/0.5 ML INJECTION SOLUTION WRAPPER: 1 1 mg/0.5 mL | INTRAMUSCULAR | Qty: 0.5

## 2021-02-21 MED FILL — LIDOCAINE (PF) 10 MG/ML (1 %) INJECTION SOLUTION: 10 10 mg/mL (1 %) | INTRAMUSCULAR | Qty: 30

## 2021-02-21 MED FILL — MISOPROSTOL 200 MCG TABLET: 200 200 mcg | ORAL | Qty: 4

## 2021-02-21 MED FILL — ERYTHROMYCIN 5 MG/GRAM (0.5 %) EYE OINTMENT: 5 5 mg/gram (0.5 %) | OPHTHALMIC | Qty: 1

## 2021-02-21 NOTE — Progress Notes (Addendum)
 Updates since last note:   - Admission labs wnl except mild elevation of AST to 47 but with lab note "hemolyzed" which can inc results  - Received misoprostol PO #1 @1713   - Received misoprostol PO #2 @2139   - FHR: baseline wnl, mod variability, accels present, significant decels absent  - Toco: q3-4 minute contractions  - Patient endorsing increased pelvic pressure but not painful contractions    Defer repeat exam at this time as not painfully contracting.  Will plan to start PCN now for GBS prophylaxis given GBS bacteruria.  Will plan to transition from misoprostol to pitocin when due for next intervention.  Patient will let us know if/when she desires pain relief medications.  Plan reviewed with patient's RN who agrees.    Rogelia Boga, MD  [facility]

## 2021-02-21 NOTE — Care Plan (Signed)
Problem: Adult Inpatient Plan of Care  Goal: Plan of Care Review  Outcome: Ongoing, Progressing  Goal: Patient-Specific Goal (Individualized)  Outcome: Ongoing, Progressing  Goal: Absence of Hospital-Acquired Illness or Injury  Outcome: Ongoing, Progressing  Goal: Optimal Comfort and Wellbeing  Outcome: Ongoing, Progressing  Goal: Readiness for Transition of Care  Outcome: Ongoing, Progressing     Problem: Bleeding (Labor)  Goal: Hemostasis  Outcome: Ongoing, Progressing     Problem: Change in Fetal Wellbeing (Labor)  Goal: Stable Fetal Wellbeing  Outcome: Ongoing, Progressing     Problem: Delayed Labor Progression (Labor)  Goal: Effective Progression to Delivery  Outcome: Ongoing, Progressing     Problem: Infection (Labor)  Goal: Absence of Infection Signs and Symptoms  Outcome: Ongoing, Progressing     Problem: Labor Pain (Labor)  Goal: Acceptable Pain Control  Outcome: Ongoing, Progressing     Problem: Uterine Tachysystole (Labor)  Goal: Normal Uterine Contraction Pattern  Outcome: Ongoing, Progressing   Goal Outcome Evaluation:

## 2021-02-21 NOTE — H&P (Signed)
Obstetrics H&P    Chief Complaint: IOL for cholestasis of pregnancy     HPI: 31 y.o. y/o [id number] at [redacted]w[redacted]d presents to L&D for IOL for new diagnosis of cholestasis of pregnancy based on mildly elevated bile acids that just recently resulted with total bile acids value of 13.2. They had been sent in the setting of pruritis of hands/feet. She reports overall feeling well. Denies HA, CP, SOB, dizziness, N/V/D/F, VB, LOF, contractions, abdominal pain. Endorses normal FM. Agrees with rec for IOL today.    OB History     Gravida   6    Para   2    Term   2    Preterm        AB   3    Living   2       SAB   2    IAB        Ectopic        Multiple        Live Births   2                 Past Medical History:   Diagnosis Date   . Migraines        Social History     Tobacco Use   . Smoking status: Never   . Smokeless tobacco: Never   Substance Use Topics   . Alcohol use: Not Currently       History reviewed. No pertinent surgical history.     No Known Allergies       Current Facility-Administered Medications:   .  acetaminophen (Tylenol) tablet 650 mg, 650 mg, oral, q6h PRN **OR** acetaminophen (Tylenol) liquid 650 mg, 650 mg, oral, q6h PRN **OR** acetaminophen (Tylenol) suppository 650 mg, 650 mg, rectal, q6h PRN, Rogelia Boga, MD  .  carboprost (Hemabate) injection 250 mcg, 250 mcg, intramuscular, Once PRN, Rogelia Boga, MD  .  carboprost (Hemabate) injection 250 mcg, 250 mcg, intramuscular, q15 min PRN, Rogelia Boga, MD  .  lactated Ringer's infusion, 125 mL/hr, intravenous, Continuous, Rogelia Boga, MD  .  lidocaine PF (Xylocaine) 10 mg/mL (1 %) injection 200 mg, 20 mL, infiltration, Once PRN, Rogelia Boga, MD  .  methylergonovine (Methergine) injection 200 mcg, 200 mcg, intramuscular, Once PRN, Rogelia Boga, MD  .  methylergonovine (Methergine) tablet 200 mcg, 200 mcg, oral, q4h PRN, Rogelia Boga, MD  .  mineral oil liquid 30 mL, 30 mL, Topical, Once PRN, Rogelia Boga, MD  .  miSOPROStoL (Cytotec) tablet 1,000 mcg, 1,000  mcg, rectal, Once PRN, Rogelia Boga, MD  .  ondansetron ODT (Zofran-ODT) disintegrating tablet 4 mg, 4 mg, oral, q8h PRN **OR** ondansetron (Zofran) injection 4 mg, 4 mg, intravenous, q8h PRN, Rogelia Boga, MD  .  oxytocin (Pitocin) infusion in lactated Ringers 30 units/500 mL, 5-12 Units/hr, intravenous, Continuous, Rogelia Boga, MD  .  oxytocin (Pitocin) injection 10 Units, 10 Units, intramuscular, Once PRN, Rogelia Boga, MD  .  penicillin G potassium 5 Million Units in sodium chloride 0.9 % 100 mL IVPB-MBP, 5 Million Units, intravenous, q4h **FOLLOWED BY** penicillin G potassium IVPB solution 2.5 Million Units, 2.5 Million Units, intravenous, q4h, Rogelia Boga, MD  .  Insert peripheral IV, , , Once **AND** Saline lock IV, , , Once **AND** sodium chloride 0.9 % flush 3 mL, 3 mL, intravenous, q8h PRN, Rogelia Boga, MD  .  tranexamic acid (Cyklokapron) 1,000 mg in sodium chloride 0.9 % 100 mL IVPB- MBP, 1,000 mg, intravenous,  Once PRN, Rogelia Boga, MD     Review of Systems: 10 systems reviewed. All negative except as per HPI.     Physical Exam:  Vitals:    02/21/21 1521   BP: 124/77   Pulse:    Resp:    Temp:    SpO2:      GEN: NAD  NEURO: alert and oriented x 3  PSYCH: normal mood and affect  CV: regular rate  RESP: regular rate and effort  ABD: soft, gravid, AGA, non-tender, non-distended  EXT: no calf tenderness, no edema  SKIN: no visible lesions    FHT: baseline 130, variability moderate, accels present, rare variables  TOCO: irregular contractions  CE: 1/30/-4, adequate pelvis by exam, vertex by exam  Leopolds: 7lb    Labs:  - Total bile acids 12.3 02/08/21  - H/H 9.4 / 29.8, ferritin 7.7, plt 201 02/08/21  - Mild elevation A1c of 5.7% 08/01/20 > early 1hr GLT mildly elevated at 132 10/12/20 > early 3hr GLT wnl 10/30/20 > regular 3hr GLT wnl 12/22/20 (so no GDM)  - HIV, HCsAb, Syphilis negative 12/22/20  - UCx negative 08/01/20 but then GBS+ 10/25/20  - HIV, HCsAb, HBsAg, RPR, GC/CT negative 08/01/20  - Rubella  immune, Hgb electrophoresis wnl 08/01/20  - Papsmear NILM 08/01/20 (no HPV testing as <30yo)    Fetal Survey - 10/12/20:  FINDINGS:    Fetal number:  1  Fetal position:  Cephalic    Placental location:  Anterior.  Placental appearance:  Normal.   Amniotic fluid assessment:  Adequate.    Cervix:  3.2 cm and closed.   Uterine myometrium:  Grossly normal  Right ovary:  Not identified  Left ovary:  Not identified    FHR:  150 bpm.     BPD:  4.86 cm. consistent with 20 weeks 5 days  HC:   18.21 cm. consistent with 20 weeks 5 days  AC:   15.37 cm. consistent with 20 weeks 4 days  FL:   3.31 cm. consistent with 20 weeks 3 days    Ultrasound EGA:  20 weeks 5 days  Ultrasound EDD:  02/24/2021  Established EDD:  03/02/2021    Documented Fetal Anatomy:    Head/Neck:   Lateral ventricles - Seen  Choroid plexus - Seen  Midline falx - Seen  Cavum septi pellucidi - Seen  Cerebellum - Seen  Cisterna magna - Seen  Nuchal fold - Seen    Face:   Upper lip - Seen    Chest:   Cardiac axis - Seen  4 chamber view - Seen  Left ventricular outflow tract - Seen  Right ventricular outflow tract - Seen    Abdomen:   Stomach - Seen  Bowel - Normal  Kidneys - Seen  Urinary bladder - Seen  Abdominal cord insertion - Seen  Three vessel cord - Seen    Limbs:   Right arm and hand present - Seen  Left arm and hand present - Seen  Right leg and foot present - Seen  Left leg and foot present - Seen    Spine (in sagittal and transverse plane):   Cervical - Seen  Thoracic - Seen  Lumbar - Seen  Sacral - Seen    Fetal anatomy documented as per the 2018 AIUM-ACR-ACOG-SMFM-SRU Practice Parameter for the performance of standard diagnostic ultrasound examinations.     Fetal Motion:  Normal fetal motion was observed.     IMPRESSION:  Unremarkable fetal survey.  A/P:  Sophia Brewer is a 31 y.o. [id number] at [redacted]w[redacted]d who presents for IOL for cholestasis with mild elevation of total bile acids to 13. Pregnancy also significant for GBS bacteruria and iron  deficiency anemia.    - Admit, CBC, T&S, IV fluids, COVID-19 swab  - Reviewed labor pain management options  - FHT category I, plan EFM per IOL protocol  - GBS positive by urine 10/25/20 > PCN ordered  - IDA w/ last Hgb ~9 s/p only one dose of IV venofer, follow up admission CBC, will be proactive with uterotonic agents at delivery given anemia  - Prenatal labs up to date and normal  - Admission SVE 1/30/-4, plan 2 doses of PO misoprostol and then likely transition to pitocin with AROM once head well applied to cervix and patient has received full first dose of PCN    Rogelia Boga, MD  [facility]

## 2021-02-21 NOTE — Telephone Encounter (Signed)
 T/c to pt to review elevated TBA. Pt states baby very active. Itching improved. Rev IHCP and need for Induction due to risk SB. Pt agees to [facility]ST today ( booked 330 pm) and IOL ASAP. REQ sent for IOL today or tomorrow. Daphane Shepherd scheduler aware and will call DR Marina Goodell on call to see if possible today. IF not will coordinate for tomorrow or ASAP. Pt agreebale.

## 2021-02-22 ENCOUNTER — Inpatient Hospital Stay (HOSPITAL_BASED_OUTPATIENT_CLINIC_OR_DEPARTMENT_OTHER): Admitting: Anesthesiology

## 2021-02-22 ENCOUNTER — Encounter: Payer: PRIVATE HEALTH INSURANCE | Primary: Internal Medicine

## 2021-02-22 ENCOUNTER — Encounter

## 2021-02-22 ENCOUNTER — Encounter (HOSPITAL_BASED_OUTPATIENT_CLINIC_OR_DEPARTMENT_OTHER): Admission: RE | Disposition: A | Discharge status: Home / Self Care | Source: Ambulatory Visit | Attending: Specialist

## 2021-02-22 ENCOUNTER — Encounter: Payer: PRIVATE HEALTH INSURANCE | Attending: Advanced Practice Midwife | Primary: Internal Medicine

## 2021-02-22 SURGERY — Surgical Case
Anesthesia: Monitor Anesthesia Care | Wound class: Class II/ Clean Contaminated

## 2021-02-22 MED ORDER — oxytocin (Pitocin) injection
10 | INTRAMUSCULAR | Status: DC | PRN
Start: 2021-02-22 — End: 2021-02-22
  Administered 2021-02-23: 02:00:00 30 via INTRAVENOUS

## 2021-02-22 MED ORDER — azithromycin (Zithromax) 500 mg injection  - Omnicell Override Pull
500 | INTRAVENOUS | Status: AC
Start: 2021-02-22 — End: ?

## 2021-02-22 MED ORDER — methylergonovine (Methergine) 0.2 mg/mL (1 mL) injection  - Omnicell Override Pull
0.2 | INTRAMUSCULAR | Status: AC
Start: 2021-02-22 — End: ?

## 2021-02-22 MED ORDER — morphine PF (Duramorph) 1 mg/mL injection  - Omnicell Override Pull
1 | INTRAMUSCULAR | Status: AC
Start: 2021-02-22 — End: ?

## 2021-02-22 MED ORDER — ceFAZolin (Ancef) 2 gram injection  - Omnicell Override Pull
2 | INTRAMUSCULAR | Status: AC
Start: 2021-02-22 — End: ?

## 2021-02-22 MED ORDER — oxytocin (Pitocin) infusion in lactated Ringers 30 units/500 mL
30 | INTRAVENOUS | Status: AC
Start: 2021-02-22 — End: 2021-02-23
  Administered 2021-02-23: 03:00:00 125 mL/h via INTRAVENOUS

## 2021-02-22 MED ORDER — zolpidem (Ambien) 5 mg tablet  - Omnicell Override Pull
5 | ORAL | Status: AC
Start: 2021-02-22 — End: ?

## 2021-02-22 MED ORDER — penicillin G potassium 2.5 MUNIT/100 ML IVPB solution  - Omnicell Override Pull
2.5 | INTRAVENOUS | Status: AC
Start: 2021-02-22 — End: ?

## 2021-02-22 MED ORDER — tranexamic acid (Cyklokapron) 1,000 mg/10 mL (100 mg/mL) injection  - Omnicell Override Pull
1000 | INTRAVENOUS | Status: AC
Start: 2021-02-22 — End: ?

## 2021-02-22 MED ORDER — ondansetron (Zofran) 4 mg/2 mL injection  - Omnicell Override Pull
4 | INTRAMUSCULAR | Status: AC
Start: 2021-02-22 — End: ?

## 2021-02-22 MED ORDER — ketorolac (Toradol) 30 mg/mL (1 mL) injection  - Omnicell Override Pull
30 | INTRAMUSCULAR | Status: AC
Start: 2021-02-22 — End: ?

## 2021-02-22 MED ORDER — ceFAZolin (Ancef) injection
1 | INTRAMUSCULAR | Status: DC | PRN
Start: 2021-02-22 — End: 2021-02-22
  Administered 2021-02-23: 02:00:00 2 via INTRAVENOUS

## 2021-02-22 MED ORDER — ketorolac (Toradol) injection 30 mg
30 | Freq: Four times a day (QID) | INTRAMUSCULAR | Status: DC | PRN
Start: 2021-02-22 — End: 2021-02-26
  Administered 2021-02-23: 04:00:00 30 mg via INTRAVENOUS

## 2021-02-22 MED ORDER — miSOPROStoL (Cytotec) 200 mcg tablet  - Omnicell Override Pull
200 | ORAL | Status: AC
Start: 2021-02-22 — End: ?

## 2021-02-22 MED ORDER — oxytocin in lactated Ringers (Pitocin) 30 unit/500 mL infusion  - Omnicell Override Pull
30 | INTRAVENOUS | Status: AC
Start: 2021-02-22 — End: ?

## 2021-02-22 MED ORDER — carboprost (Hemabate) 250 mcg/mL injection  - Omnicell Override Pull
250 | INTRAMUSCULAR | Status: AC
Start: 2021-02-22 — End: ?

## 2021-02-22 MED ORDER — morphine PF (Duramorph) injection
1 | Freq: Once | INTRAMUSCULAR | Status: AC | PRN
Start: 2021-02-22 — End: 2021-02-22
  Administered 2021-02-22: 14:00:00 .2 via INTRATHECAL

## 2021-02-22 MED ORDER — fentaNYL-bupivacaine 2 mcg/mL- 0.125 % PCEA  - Omnicell Override Pull
2 | EPIDURAL | Status: AC
Start: 2021-02-22 — End: ?

## 2021-02-22 MED ORDER — ondansetron (Zofran) injection 4 mg
4 | Freq: Three times a day (TID) | INTRAMUSCULAR | Status: DC | PRN
Start: 2021-02-22 — End: 2021-02-23

## 2021-02-22 MED ORDER — azithromycin (Zithromax) injection
500 | INTRAVENOUS | Status: DC | PRN
Start: 2021-02-22 — End: 2021-02-22
  Administered 2021-02-23: 02:00:00 500 via INTRAVENOUS

## 2021-02-22 MED ORDER — fentaNYL-bupivacaine 2 mcg/mL- 0.125 % PCEA
2 | EPIDURAL | Status: DC
Start: 2021-02-22 — End: 2021-02-26

## 2021-02-22 MED ORDER — bupivacaine-dextrose-water(PF) (Marcaine Spinal) 0.75 % (7.5 mg/mL) injection
0.75 | Freq: Once | INTRAMUSCULAR | Status: AC | PRN
Start: 2021-02-22 — End: 2021-02-22
  Administered 2021-02-22: 14:00:00 1.2 via INTRATHECAL

## 2021-02-22 MED ORDER — ePHEDrine sulfate-0.9%NaCl(PF) 25 mg/5 mL (5 mg/mL) syringe 10 mg
25 | INTRAVENOUS | Status: DC | PRN
Start: 2021-02-22 — End: 2021-02-23

## 2021-02-22 MED ORDER — famotidine (Pepcid) 20 mg/2 mL injection  - Omnicell Override Pull
20 | INTRAVENOUS | Status: AC
Start: 2021-02-22 — End: ?

## 2021-02-22 MED ORDER — naloxone (Narcan) injection 0.1 mg
0.4 | INTRAMUSCULAR | Status: DC | PRN
Start: 2021-02-22 — End: 2021-02-23

## 2021-02-22 MED FILL — PENICILLIN G POTASSIUM 2.5 MILLION UNIT/100 ML IN NS: 2.5 2.5 MUNIT/100 ML | INTRAVENOUS | Qty: 100

## 2021-02-22 MED FILL — FAMOTIDINE (PF) 20 MG/2 ML INTRAVENOUS SOLUTION: 20 20 mg/2 mL | INTRAVENOUS | Qty: 2

## 2021-02-22 MED FILL — TRANEXAMIC ACID 1,000 MG/10 ML (100 MG/ML) INTRAVENOUS SOLUTION: 1000 1,000 mg/10 mL (100 mg/mL) | INTRAVENOUS | Qty: 10

## 2021-02-22 MED FILL — FENTANYL 2 MCG/ML-BUPIVACAINE 0.125 % PCEA (CIRCLE): 2 2 mcg/mL- 0.125 % | EPIDURAL | Qty: 250

## 2021-02-22 MED FILL — ONDANSETRON HCL (PF) 4 MG/2 ML INJECTION SOLUTION: 4 4 mg/2 mL | INTRAMUSCULAR | Qty: 2

## 2021-02-22 MED FILL — PENICILLIN G POTASSIUM 5 MILLION UNIT SOLUTION FOR INJECTION: 5 5 million unit | INTRAMUSCULAR | Qty: 5

## 2021-02-22 MED FILL — MISOPROSTOL 25 MCG SPLIT TABS: 25 25 MCG | ORAL | Qty: 2

## 2021-02-22 MED FILL — CEFAZOLIN 2 GRAM SOLUTION FOR INJECTION: 2 2 gram | INTRAMUSCULAR | Qty: 2000

## 2021-02-22 MED FILL — ZOLPIDEM 5 MG TABLET: 5 5 mg | ORAL | Qty: 1

## 2021-02-22 SURGICAL SUPPLY — 21 items
ADHESIVE SKIN TOPICAL DERMABOND DNX12 (Dressing) ×1
BARRIER ADHESION INTERCEED 4350 (Surgical Supply General) ×1
BLADE PROTECTED SURGICAL SZ.10 (Blade) ×1
BLADE PROTECTED SURGICAL SZ.21 (Blade) ×1
CHLORAPREP 26ML 260815 (Prep) ×1
DRESSING TELFA NON-ADHERENT 3X4 1050 (Dressing) ×1
GLOVE GMX PI LT GN 6.0 (Medical Supply General) ×1
GLOVE GMX PI LT GN 6.5 (Glove) ×1
OXYGEN OP - SURGERY (Oxygen And Other Non Anesth Gas) ×1
PACK C-SECTION DISP (OB/GYN Supply) ×1
PAD ABDOMINAL STERILE 7196D (Dressing) ×1
PAD BOVIE 0855C (Pt Monitoring Supply) ×1
PENCIL SMOKE CAUTERY 251010J (Cutting/Coagulating Electrode) ×1
SOL 0.9 NACL 1LT L8000 (Solution) ×2
SPONGE GAUZE 4X4 12PLY STERILE (Dressing) ×1
SPONGE LAP 18X18   1515 (Sponge) ×2
SUCTION YANKAUER (Surgical Supply General) ×1
SUTURE MONOCRYL 4-0 PS-2 MCP426H (Suture) ×1
SUTURE PLAIN 3-0 XLH 52T (Suture) ×1
SUTURE V-LOC 180 VLOCLO436 (Suture) ×1
SUTURE VICRYL 0 CT-1 VCP346H (Suture) ×2

## 2021-02-22 NOTE — Anesthesia Procedure Notes (Signed)
Epidural Block    Patient Location:  OB  Reason for Block: labor analgesia    Staff:     Performed by:  Attending    Anesthesiologist:  Alexus Galka, MD  patient identified, risks and benefits discussed, monitors and equipment checked, pre-op evaluation, timeout performed and anesthesia consent    Patient Position:  Sitting  Prep: ChloraPrep    Sterility Prep:  Drape, mask and sterile gloves  Sedation Level: awake    Monitoring:  Continuous pulse oximetry, blood pressure and heart rate  Approach:  Midline  Lumbar:  L3-4  LOR Technique:  LOR saline  Guidance: landmarks    Needle and Epidural Catheter:     Needle Type:  Tuohy    Needle Gauge:  17 G    Needle Length:  10 cm  Catheter Type:  Multi-orifice  Catheter Securement Method:  Occlusive dressing and tape  Test Dose Result:  Negative  1% lidocaine used to infiltrate the skin and subcutaneous tissue    Assessment:     Number of Attempts:  1    Procedure Assessment: patient tolerated procedure well with no complications

## 2021-02-22 NOTE — Care Plan (Signed)
Problem: Bleeding (Labor)  Goal: Hemostasis  02/22/2021 2233 by Erling Conte, RN  Outcome: Met  02/22/2021 2233 by Erling Conte, RN  Outcome: Adequate for Care Transition     Problem: Change in Fetal Wellbeing (Labor)  Goal: Stable Fetal Wellbeing  02/22/2021 2233 by Erling Conte, RN  Outcome: Met  02/22/2021 2233 by Erling Conte, RN  Outcome: Adequate for Care Transition     Problem: Delayed Labor Progression (Labor)  Goal: Effective Progression to Delivery  02/22/2021 2233 by Erling Conte, RN  Outcome: Met  02/22/2021 2233 by Erling Conte, RN  Outcome: Adequate for Care Transition     Problem: Infection (Labor)  Goal: Absence of Infection Signs and Symptoms  02/22/2021 2233 by Erling Conte, RN  Outcome: Met  02/22/2021 2233 by Erling Conte, RN  Outcome: Adequate for Care Transition     Problem: Labor Pain (Labor)  Goal: Acceptable Pain Control  02/22/2021 2233 by Erling Conte, RN  Outcome: Met  02/22/2021 2233 by Erling Conte, RN  Outcome: Adequate for Care Transition     Problem: Uterine Tachysystole (Labor)  Goal: Normal Uterine Contraction Pattern  02/22/2021 2233 by Erling Conte, RN  Outcome: Met  02/22/2021 2233 by Erling Conte, RN  Outcome: Adequate for Care Transition   Goal Outcome Evaluation:

## 2021-02-22 NOTE — Anesthesia Pre-Procedure Evaluation (Addendum)
Patient: Sophia Brewer    Procedure Information    Date: 02/22/21  Procedure: Labor Analgesia         Relevant Problems   Anesthesia   (+) History of abnormal cervical Pap smear      GI   (+) Intrahepatic cholestasis of pregnancy      Hematology   (+) Anemia affecting pregnancy in third trimester       Clinical information reviewed:   Tobacco  Allergies  Meds  Problems  Med Hx  Surg Hx   Fam Hx  Soc   Hx         OB/Gyn Evaluation    Physical Exam    Airway  Mallampati: II     Cardiovascular   Rhythm: regular     Dental    Pulmonary    Abdominal    General   Alert                 Anesthesia Plan    ASA 2     MAC     Airway: natural airway  Monitoring: standard monitors      Anesthetic plan and risks discussed with patient.

## 2021-02-22 NOTE — Op Note (Signed)
CESAREAN  DELIVERY  OPERATIVE  REPORT    Sophia Brewer 1991-01-28 [id number] at 5020w6d    Preop Dx: Failure to progress, nonreassuring fetal status  Postop Dx: Same, CPD, persistent occiput posterior, delivered  Surgeon: Zachary GeorgeMichelle M. Berna Bueochran, MD  Assistant: Ofilia NeasJessica Verosko, MD  Procedure:  Low Transverse Cesarean Section via Pfannenstiel Skin Incision  Anesthesia: Spinal  Antibiotics: Ancef 2g IV  EBL: 750cc   QBL:  See RN Delivery note  IVF: 1liter  UO: 85cc, bloody at start of case, clearing to yellow at end of case  Specimens: Cord blood to lab, placenta to hold  Findings:   Female Infant delivered at 2102hh in vertex presentation, direct OP position. Apgars 8, 9 and weight 8lb 1oz. Normal appearing uterus, tubes and ovaries      Informed Consent:  The indication for c/s, risks, benefits, complications, and alternatives were discussed with the patient. The patient understood that the risks of Cesarean section include, but are not limited to: injury to nearby structures or organs, infection, blood loss and possible need for transfusion, failure to immediately recognize a complication, and need for additional surgery including hysterectomy. The patient stated understanding and desired to proceed. All questions were answered and written consent obtained.      Procedure Details:  The patient was taken to the operating room where spinal anesthesia placed and made adequate for surgery. Antibiotics were given for infection prophylaxis, pneumatic boots were placed on her lower extremities and a vaginal prep with betadine was performed.  The foley was noted to be draining bloody urine. FH was noted to be 140's. She was prepped and draped in the normal sterile fashion and placed in the dorsal supine position.  A Time Out was performed and the patient and surgery were confirmed.    After testing to be sure an adequate level of anesthesia had been achieved, a Pfannenstiel skin incision was made with the scalpel. The incision was  carried down to the level of the fascia using the Bovie electrocautery. The fascia was nicked in the midline and extended laterally using the cautery. The inferior aspect of the fascia was grasped with Kocher clamps and the underlying rectus and pyramidalis muscles were dissected off using the cautery. In a similar fashion, the superior aspect of the fascia was elevated with Kocher clamps, and the rectus muscle was dissected off. The rectus muscles were separated bluntly and the peritoneum was identified and entered bluntly. The peritoneal incision was extended superiorly and inferiorly with good visualization of the bladder.    The bladder blade was inserted and the lower uterine segment was examined, then incised with a low transverse  incision which was extended bluntly. The amniotic sac was ruptured and clear  fluid noted. The infant was noted to be in direct OP position.  The bladder blade was removed and the infant was delivered atraumatically using the usual maneuvers.  The baby was placed on the mother's abdomen and delayed cord clamping was performed for 30sec.  The cord was then clamped and cut, and the baby was handed off to the awaiting neonatal team, vigorous and crying.     Cord blood was obtained and sent to the lab. The placenta was removed via uterine massage and appeared intact and unremarkable.. The uterus was then exteriorized and cleared of all clots and debris. The hysterotomy was repaired with a #0 V-Lock suture  in a running fashion. A second imbricating layer using the same suture was performed and good hemostasis observed.  The ovaries and tubes appeared normal bilaterally. The uterus was then returned and the abdomen and pelvis were irrigated with warm, sterile saline. The uterine incision was reinspected and found to be hemostatic. The subfascial spaces were inspected and noted to be hemostatic. The pyramidalis  Muscles were reapproximated at the midline using a single 0 vicryl interrupted  suture. The fascia was then reapproximated with a single 0 vicryl running suture taking care that the apices were incorporated using an Allis clamp.   The subcutaneous tissues were irrigated with warm, sterile saline and any small bleeding points were made hemostatic using the Bovie electrocautery.  These were reapproximated using  3-0 plain interrupted sutures. The skin was reapproximated using a single 4-0 Monocryl subcuticular suture with good cosmetic result.  Dermabond was then applied  to the incision.    The urine in the foley catheter was noted to be clearing to yellow at the end of the case.    Sponge, needle and instrument counts were correct x 3.   The patient tolerated the procedure well and was taken to the recovery room in good and stable condition.  The baby was taken to the recovery room in mother's arms.    Randell Loop, MD 02/22/21 9:49 PM

## 2021-02-22 NOTE — Progress Notes (Signed)
PROGRESS NOTE    Today's Date: 02/22/2021  MRN: [id number]  Name: Sophia Brewer  DOB: December 22, 1990    Subjective   Pt more comfortable with ctx     Objective   Physical Exam   SVE 8-9/90/-2 per RN  FHT 140/mod variability/small variables with ctx  Toco q 3min, mod-strong    Last Recorded Vitals  Blood pressure 110/58, pulse 111, temperature 36.4 C (97.6 F), temperature source Oral, resp. rate 16, height 1.6 m, weight 87.5 kg, last menstrual period 05/26/2020, SpO2 100 %, not currently breastfeeding.    Medications      Start Medication Dose/Rate, Route, Frequency Ordered Stop    02/22/21 1545 fentaNYL-bupivacaine 2 mcg/mL- 0.125 % PCEA         --, EI, Continuous 02/22/21 1535 03/01/21 1544    02/22/21 1534 naloxone (Narcan) injection 0.1 mg         0.1 mg, IV, As needed 02/22/21 1535 --    02/22/21 1534 ondansetron (Zofran) injection 4 mg         4 mg, IV, Every 8 hours PRN 02/22/21 1535 02/23/21 1533    02/22/21 1534 ePHEDrine sulfate-0.9%NaCl(PF) 25 mg/5 mL (5 mg/mL) syringe 10 mg         10 mg, IV, Every 5 min PRN 02/22/21 1535 --    02/22/21 0140 oxytocin (Pitocin) infusion in lactated Ringers 30 units/500 mL         1-20 milli-units/min, IV, Continuous 02/21/21 2141 --    02/21/21 2029 zolpidem (Ambien) tablet 5 mg         5 mg, oral, Nightly PRN 02/21/21 2030 --    02/21/21 2029 butorphanol (Stadol) injection 1 mg         1 mg, IM, Every 3 hours PRN 02/21/21 2030 --    02/21/21 2029 butorphanol (Stadol) injection 1 mg         1 mg, IV, Every 3 hours PRN 02/21/21 2030 --    02/21/21 1900 penicillin G potassium IVPB solution 2.5 Million Units        See Hyperspace for full Linked Orders Report.    2.5 Million Units, IV, Every 4 hours 02/21/21 1446 02/23/21 1059    02/21/21 1500 lactated Ringer's infusion         125 mL/hr, IV, Continuous 02/21/21 1446 --    02/21/21 1500 oxytocin (Pitocin) infusion in lactated Ringers 30 units/500 mL         5-12 Units/hr, IV, Continuous 02/21/21 1446 --    02/21/21 1445  methylergonovine (Methergine) tablet 200 mcg         200 mcg, oral, Every 4 hours PRN 02/21/21 1446 --    02/21/21 1444 ondansetron ODT (Zofran-ODT) disintegrating tablet 4 mg        See Hyperspace for full Linked Orders Report.    4 mg, oral, Every 8 hours PRN 02/21/21 1446 --    02/21/21 1444 ondansetron (Zofran) injection 4 mg        See Hyperspace for full Linked Orders Report.    4 mg, IV, Every 8 hours PRN 02/21/21 1446 --    02/21/21 1444 methylergonovine (Methergine) injection 200 mcg         200 mcg, IM, Once as needed 02/21/21 1446 --    02/21/21 1444 carboprost (Hemabate) injection 250 mcg         250 mcg, IM, Once as needed 02/21/21 1446 --    02/21/21 1444 carboprost (Hemabate) injection 250 mcg  250 mcg, IM, Every 15 min PRN 02/21/21 1446 --    02/21/21 1444 miSOPROStoL (Cytotec) tablet 1,000 mcg         1,000 mcg, rect, Once as needed 02/21/21 1446 --    02/21/21 1444 oxytocin (Pitocin) injection 10 Units         10 Units, IM, Once as needed 02/21/21 1446 --    02/21/21 1444 tranexamic acid (Cyklokapron) 1,000 mg in sodium chloride 0.9 % 100 mL IVPB- MBP         1,000 mg, IV, Once as needed 02/21/21 1446 --    02/21/21 1444 mineral oil liquid 30 mL         30 mL, TP, Once as needed 02/21/21 1446 --    02/21/21 1444 sodium chloride 0.9 % flush 3 mL        See Hyperspace for full Linked Orders Report.    3 mL, IV, Every 8 hours PRN 02/21/21 1446 --    02/21/21 1444 acetaminophen (Tylenol) tablet 650 mg        See Hyperspace for full Linked Orders Report.    650 mg, oral, Every 6 hours PRN 02/21/21 1446 --    02/21/21 1444 acetaminophen (Tylenol) liquid 650 mg        See Hyperspace for full Linked Orders Report.    650 mg, oral, Every 6 hours PRN 02/21/21 1446 --    02/21/21 1444 acetaminophen (Tylenol) suppository 650 mg        See Hyperspace for full Linked Orders Report.    650 mg, rect, Every 6 hours PRN 02/21/21 1446 --                   Assessment/Plan   Principal Problem:    Intrahepatic  cholestasis of pregnancy  Active Problems:    Anemia affecting pregnancy in third trimester    Encounter for induction of labor    Supervision of other normal pregnancy, antepartum    30yo G6p2 at 38.6 wks being induced for cholestasis of pregnancy  FHT more reassuring now  Making progress with cervical change, still very high  Continue position changes       Randell Loop, MD

## 2021-02-22 NOTE — Progress Notes (Signed)
PROGRESS NOTE    Today's Date: 02/22/2021  MRN: [id number]  Name: Sophia Brewer  DOB: September 13, 1990    Subjective   Pt has epidural, feeling severe pressure with ctx.      Objective   Physical Exam   SVE 5/70/-3  Toco q2-90min  FHT125/mod variability/variable decelerations, some late decelerations with ctx    Last Recorded Vitals  Blood pressure 110/58, pulse 100, temperature 36.4 C (97.6 F), temperature source Oral, resp. rate 16, height 1.6 m, weight 87.5 kg, last menstrual period 05/26/2020, SpO2 100 %, not currently breastfeeding.    Medications      Start Medication Dose/Rate, Route, Frequency Ordered Stop    02/22/21 1545 fentaNYL-bupivacaine 2 mcg/mL- 0.125 % PCEA         --, EI, Continuous 02/22/21 1535 03/01/21 1544    02/22/21 1534 naloxone (Narcan) injection 0.1 mg         0.1 mg, IV, As needed 02/22/21 1535 --    02/22/21 1534 ondansetron (Zofran) injection 4 mg         4 mg, IV, Every 8 hours PRN 02/22/21 1535 02/23/21 1533    02/22/21 1534 ePHEDrine sulfate-0.9%NaCl(PF) 25 mg/5 mL (5 mg/mL) syringe 10 mg         10 mg, IV, Every 5 min PRN 02/22/21 1535 --    02/22/21 0140 oxytocin (Pitocin) infusion in lactated Ringers 30 units/500 mL         1-20 milli-units/min, IV, Continuous 02/21/21 2141 --    02/21/21 2029 zolpidem (Ambien) tablet 5 mg         5 mg, oral, Nightly PRN 02/21/21 2030 --    02/21/21 2029 butorphanol (Stadol) injection 1 mg         1 mg, IM, Every 3 hours PRN 02/21/21 2030 --    02/21/21 2029 butorphanol (Stadol) injection 1 mg         1 mg, IV, Every 3 hours PRN 02/21/21 2030 --    02/21/21 1900 penicillin G potassium IVPB solution 2.5 Million Units        See Hyperspace for full Linked Orders Report.    2.5 Million Units, IV, Every 4 hours 02/21/21 1446 02/23/21 1059    02/21/21 1500 lactated Ringer's infusion         125 mL/hr, IV, Continuous 02/21/21 1446 --    02/21/21 1500 oxytocin (Pitocin) infusion in lactated Ringers 30 units/500 mL         5-12 Units/hr, IV, Continuous 02/21/21  1446 --    02/21/21 1445 methylergonovine (Methergine) tablet 200 mcg         200 mcg, oral, Every 4 hours PRN 02/21/21 1446 --    02/21/21 1444 ondansetron ODT (Zofran-ODT) disintegrating tablet 4 mg        See Hyperspace for full Linked Orders Report.    4 mg, oral, Every 8 hours PRN 02/21/21 1446 --    02/21/21 1444 ondansetron (Zofran) injection 4 mg        See Hyperspace for full Linked Orders Report.    4 mg, IV, Every 8 hours PRN 02/21/21 1446 --    02/21/21 1444 methylergonovine (Methergine) injection 200 mcg         200 mcg, IM, Once as needed 02/21/21 1446 --    02/21/21 1444 carboprost (Hemabate) injection 250 mcg         250 mcg, IM, Once as needed 02/21/21 1446 --    02/21/21 1444 carboprost (Hemabate) injection 250 mcg  250 mcg, IM, Every 15 min PRN 02/21/21 1446 --    02/21/21 1444 miSOPROStoL (Cytotec) tablet 1,000 mcg         1,000 mcg, rect, Once as needed 02/21/21 1446 --    02/21/21 1444 oxytocin (Pitocin) injection 10 Units         10 Units, IM, Once as needed 02/21/21 1446 --    02/21/21 1444 tranexamic acid (Cyklokapron) 1,000 mg in sodium chloride 0.9 % 100 mL IVPB- MBP         1,000 mg, IV, Once as needed 02/21/21 1446 --    02/21/21 1444 mineral oil liquid 30 mL         30 mL, TP, Once as needed 02/21/21 1446 --    02/21/21 1444 sodium chloride 0.9 % flush 3 mL        See Hyperspace for full Linked Orders Report.    3 mL, IV, Every 8 hours PRN 02/21/21 1446 --    02/21/21 1444 acetaminophen (Tylenol) tablet 650 mg        See Hyperspace for full Linked Orders Report.    650 mg, oral, Every 6 hours PRN 02/21/21 1446 --    02/21/21 1444 acetaminophen (Tylenol) liquid 650 mg        See Hyperspace for full Linked Orders Report.    650 mg, oral, Every 6 hours PRN 02/21/21 1446 --    02/21/21 1444 acetaminophen (Tylenol) suppository 650 mg        See Hyperspace for full Linked Orders Report.    650 mg, rect, Every 6 hours PRN 02/21/21 1446 --                   Assessment/Plan   Principal  Problem:    Intrahepatic cholestasis of pregnancy  Active Problems:    Anemia affecting pregnancy in third trimester    Encounter for induction of labor    Supervision of other normal pregnancy, antepartum    31yo G6p2 at 38.6 wks being induced for cholestasis of pregnancy with total bile acids 13.2.    Pt has made very slow progress during induction, minimal descent with head still very high, not well applied.  We have attempted numerous position changes to try to encourage fetal descent.  FHT has periods during which  decels which appear more late, then with position change will look more variable. Variability is excellent throughout, which is reassuring.  Have discussed with pt that we are watching closely and that there is possibility of requiring c/s if fetal status becomes less reassuring or if it appears that baby is not fitting. She expressed her understanding and is prepared to proceed with surgery if needed.     Randell Loop, MD

## 2021-02-22 NOTE — Progress Notes (Signed)
PROGRESS NOTE    Today's Date: 02/22/2021  MRN: [id number]  Name: Sophia Brewer  DOB: [birth year]    Subjective   Pt comfortable, feeling tightening.     Objective   Physical Exam   SVE: 1.5/20/-3 AROM clear fluid  FHT 130/mod variability/accels  Toco q 2-40min, mild    Last Recorded Vitals  Blood pressure 114/78, pulse 97, temperature 36.6 C (97.9 F), temperature source Oral, resp. rate 16, height 1.6 m, weight 87.5 kg, last menstrual period 05/26/2020, SpO2 99 %, not currently breastfeeding.    Medications      Start Medication Dose/Rate, Route, Frequency Ordered Stop    02/22/21 0140 oxytocin (Pitocin) infusion in lactated Ringers 30 units/500 mL         1-20 milli-units/min, IV, Continuous 02/21/21 2141 --    02/21/21 2029 zolpidem (Ambien) tablet 5 mg         5 mg, oral, Nightly PRN 02/21/21 2030 --    02/21/21 2029 butorphanol (Stadol) injection 1 mg         1 mg, IM, Every 3 hours PRN 02/21/21 2030 --    02/21/21 2029 butorphanol (Stadol) injection 1 mg         1 mg, IV, Every 3 hours PRN 02/21/21 2030 --    02/21/21 1900 penicillin G potassium IVPB solution 2.5 Million Units        See Hyperspace for full Linked Orders Report.    2.5 Million Units, IV, Every 4 hours 02/21/21 1446 02/23/21 1059    02/21/21 1500 lactated Ringer's infusion         125 mL/hr, IV, Continuous 02/21/21 1446 --    02/21/21 1500 oxytocin (Pitocin) infusion in lactated Ringers 30 units/500 mL         5-12 Units/hr, IV, Continuous 02/21/21 1446 --    02/21/21 1445 methylergonovine (Methergine) tablet 200 mcg         200 mcg, oral, Every 4 hours PRN 02/21/21 1446 --    02/21/21 1444 ondansetron ODT (Zofran-ODT) disintegrating tablet 4 mg        See Hyperspace for full Linked Orders Report.    4 mg, oral, Every 8 hours PRN 02/21/21 1446 --    02/21/21 1444 ondansetron (Zofran) injection 4 mg        See Hyperspace for full Linked Orders Report.    4 mg, IV, Every 8 hours PRN 02/21/21 1446 --    02/21/21 1444 methylergonovine (Methergine)  injection 200 mcg         200 mcg, IM, Once as needed 02/21/21 1446 --    02/21/21 1444 carboprost (Hemabate) injection 250 mcg         250 mcg, IM, Once as needed 02/21/21 1446 --    02/21/21 1444 carboprost (Hemabate) injection 250 mcg         250 mcg, IM, Every 15 min PRN 02/21/21 1446 --    02/21/21 1444 miSOPROStoL (Cytotec) tablet 1,000 mcg         1,000 mcg, rect, Once as needed 02/21/21 1446 --    02/21/21 1444 oxytocin (Pitocin) injection 10 Units         10 Units, IM, Once as needed 02/21/21 1446 --    02/21/21 1444 tranexamic acid (Cyklokapron) 1,000 mg in sodium chloride 0.9 % 100 mL IVPB- MBP         1,000 mg, IV, Once as needed 02/21/21 1446 --    02/21/21 1444 mineral oil liquid 30 mL  30 mL, TP, Once as needed 02/21/21 1446 --    02/21/21 1444 lidocaine PF (Xylocaine) 10 mg/mL (1 %) injection 200 mg         20 mL, Ifil, Once as needed 02/21/21 1446 --    02/21/21 1444 sodium chloride 0.9 % flush 3 mL        See Hyperspace for full Linked Orders Report.    3 mL, IV, Every 8 hours PRN 02/21/21 1446 --    02/21/21 1444 acetaminophen (Tylenol) tablet 650 mg        See Hyperspace for full Linked Orders Report.    650 mg, oral, Every 6 hours PRN 02/21/21 1446 --    02/21/21 1444 acetaminophen (Tylenol) liquid 650 mg        See Hyperspace for full Linked Orders Report.    650 mg, oral, Every 6 hours PRN 02/21/21 1446 --    02/21/21 1444 acetaminophen (Tylenol) suppository 650 mg        See Hyperspace for full Linked Orders Report.    650 mg, rect, Every 6 hours PRN 02/21/21 1446 --                   Assessment/Plan   Principal Problem:    Intrahepatic cholestasis of pregnancy  Active Problems:    Anemia affecting pregnancy in third trimester    Encounter for induction of labor    Supervision of other normal pregnancy, antepartum    31yo G6p2 at 38.6 who was admitted yesterday for induction with cholestasis of pregnancy.   On pitocin and contracting but feeling only tightening  Able to AROM for clear  fluid  FWB reassuring, cat 1  Anticipate SVD  PCN for GBS prophylaxis, had urine with GBS during pregnancy       Randell Loop, MD

## 2021-02-22 NOTE — Progress Notes (Signed)
Labor Progress Note     Subjective:     In to see pt for deep variables at 8pm, pt crying with ctx.       Objective:     BP (!) 90/50   Pulse 118   Temp 36.8 C (98.3 F) (Oral)   Resp 16   Ht 1.6 m   Wt 87.5 kg   LMP 05/26/2020 (Exact Date)   SpO2 99%   BMI 34.19 kg/m     SVE:  6/very edematous/-3, caput noted    Membrane Status: clear fluid     FHR: 150/mod variability/deep variables with ctx, some with late component    Toco: q    Foley draining blood-tinged urine    Assessment and Plan:     31 y.o. [id number] at [redacted]w[redacted]d being induced for cholestasis of pregnancy, again with deep variables with ctx despite position changes. Variability of FHT has been moderate throughout, so have been managing expectantly with anticipation of vaginal delivery.  Last exam was 8-9cm/90/stretchy per RN but on my exam now, cervix is very edematous and 6cm. Vertex is still -3 station and feels OP with molding and now small amt caput.  Urine is bloody and pt crying with ctx, describing intense pelvic pressure despite very high station.  Clinical picture is most c/w obstructed labor. Given repetitive FH decelerations remote from delivery and lack of progress, I recommend a c/s. PT is in agreement.  Will give Ancef and Azithromycin preop/vaginal prep in OR  Anesthesia notified.  Risks and benefits reviewed and consent signed.      Randell Loop, MD

## 2021-02-22 NOTE — Anesthesia Procedure Notes (Signed)
Spinal Block    Date/Time: 02/22/2021 8:45 AM    Patient Location:  OB  Reason for Block: primary anesthetic    Performed by:  Attending  Anesthesiologist:  Leslye PeerVahila Christo Hain, MD  patient identified, anesthesia consent, monitors and equipment checked, pre-op evaluation and timeout performed    Patient Position:  Sitting  Prep: ChloraPrep    Sterility Prep:  Drape, sterile gloves, cap and mask  Sedation Level: awake    Monitoring:  Continuous pulse oximetry and blood pressure  Approach:  Midline  Location:  L3-4  Guidance: landmarks    Needle Type:  Whitacre  Needle Gauge:  25 G  Needle Length:  10 cm  1% lidocaine used to infiltrate the skin and subcutaneous tissue     bupivacaine-dextrose-water(PF) (Marcaine Spinal) 0.75 % (7.5 mg/mL) injection - intrathecal   1.2 mL - 02/22/2021 8:55:00 AM  morphine PF (Duramorph) injection - intrathecal   0.2 mg - 02/22/2021 8:55:00 AM  Procedure Assessment: patient tolerated procedure well with no complications

## 2021-02-22 NOTE — Care Plan (Signed)
Problem: Adult Inpatient Plan of Care  Goal: Plan of Care Review  Outcome: Ongoing, Progressing  Goal: Patient-Specific Goal (Individualized)  Outcome: Ongoing, Progressing  Goal: Absence of Hospital-Acquired Illness or Injury  Outcome: Ongoing, Progressing  Goal: Optimal Comfort and Wellbeing  Outcome: Ongoing, Progressing  Goal: Readiness for Transition of Care  Outcome: Ongoing, Progressing   Goal Outcome Evaluation:

## 2021-02-22 NOTE — Nursing Note (Signed)
0700-patient sleeping upon Rn entering room.  Loss of EFM contact dur to maternal position.  EFM and TOCO adjusted.  Patient states she is having 4/10 abdominal discomfort with contraction, declines anything for pain relief at this time.  Abdomen palpated and soft in between contractions.

## 2021-02-23 ENCOUNTER — Encounter (HOSPITAL_BASED_OUTPATIENT_CLINIC_OR_DEPARTMENT_OTHER): Admitting: Advanced Practice Midwife

## 2021-02-23 ENCOUNTER — Encounter (HOSPITAL_BASED_OUTPATIENT_CLINIC_OR_DEPARTMENT_OTHER): Admitting: Student in an Organized Health Care Education/Training Program

## 2021-02-23 LAB — BASIC METABOLIC PANEL
Anion Gap: 6 mmol/L (ref 3–14)
BUN: 3 mg/dL — ABNORMAL LOW (ref 6–24)
CO2 (Bicarbonate): 26 mmol/L (ref 20–32)
Calcium: 8.4 mg/dL — ABNORMAL LOW (ref 8.5–10.5)
Chloride: 108 mmol/L (ref 98–110)
Creatinine: 0.5 mg/dL — ABNORMAL LOW (ref 0.55–1.30)
Glucose: 69 mg/dL — ABNORMAL LOW (ref 70–110)
Potassium: 4.1 mmol/L (ref 3.6–5.2)
Sodium: 140 mmol/L (ref 135–146)
eGFRcr: 131 mL/min/{1.73_m2} (ref >=60)

## 2021-02-23 LAB — CBC WITH DIFFERENTIAL
Basophils %: 0.2 %
Basophils Absolute: 0.02 10*3/uL (ref 0.00–0.22)
Eosinophils %: 0.5 %
Eosinophils Absolute: 0.06 10*3/uL (ref 0.00–0.50)
Hematocrit: 24.5 % — ABNORMAL LOW (ref 32.0–47.0)
Hemoglobin: 7.7 g/dL — ABNORMAL LOW (ref 11.0–16.0)
Immature Granulocytes %: 0.5 %
Immature Granulocytes Absolute: 0.06 10*3/uL (ref 0.00–0.10)
Lymphocyte %: 19 %
Lymphocytes Absolute: 2.14 10*3/uL (ref 0.70–4.00)
MCH: 23.8 pg — ABNORMAL LOW (ref 26.0–34.0)
MCHC: 31.4 g/dL (ref 31.0–37.0)
MCV: 75.9 fL — ABNORMAL LOW (ref 80.0–100.0)
MPV: 10 fL (ref 9.1–12.4)
Monocytes %: 8.7 %
Monocytes Absolute: 0.98 10*3/uL — ABNORMAL HIGH (ref 0.36–0.77)
NRBC %: 0.2 % — ABNORMAL HIGH (ref 0.0–0.0)
NRBC Absolute: 0.02 10*3/uL (ref 0.00–2.00)
Neutrophil %: 71.1 %
Neutrophils Absolute: 8 10*3/uL — ABNORMAL HIGH (ref 1.50–7.95)
Platelets: 148 10*3/uL — ABNORMAL LOW (ref 150–400)
RBC: 3.23 M/uL — ABNORMAL LOW (ref 3.70–5.20)
RDW-CV: 14.6 % — ABNORMAL HIGH (ref 11.5–14.5)
RDW-SD: 39.4 fL (ref 35.0–51.0)
WBC: 11.3 10*3/uL — ABNORMAL HIGH (ref 4.0–11.0)

## 2021-02-23 MED ORDER — metoclopramide (Reglan) injection 10 mg
5 | Freq: Four times a day (QID) | INTRAMUSCULAR | Status: DC | PRN
Start: 2021-02-23 — End: 2021-02-26

## 2021-02-23 MED ORDER — diphenhydrAMINE (BENADryl) capsule 25 mg
25 | Freq: Four times a day (QID) | ORAL | Status: DC | PRN
Start: 2021-02-23 — End: 2021-02-26

## 2021-02-23 MED ORDER — bisacodyl (Dulcolax) EC tablet 10 mg
5 | Freq: Every day | ORAL | Status: DC | PRN
Start: 2021-02-23 — End: 2021-02-26

## 2021-02-23 MED ORDER — oxyCODONE (Roxicodone) immediate release tablet 5 mg
5 | ORAL | Status: DC | PRN
Start: 2021-02-23 — End: 2021-02-26
  Administered 2021-02-24 – 2021-02-26 (×4): 5 mg via ORAL

## 2021-02-23 MED ORDER — metoclopramide (Reglan) tablet 10 mg
10 | Freq: Four times a day (QID) | ORAL | Status: DC | PRN
Start: 2021-02-23 — End: 2021-02-26

## 2021-02-23 MED ORDER — methylergonovine (Methergine) injection 200 mcg
0.2 | Freq: Once | INTRAMUSCULAR | Status: DC | PRN
Start: 2021-02-23 — End: 2021-02-26

## 2021-02-23 MED ORDER — carboprost (Hemabate) injection 250 mcg
250 | Freq: Once | INTRAMUSCULAR | Status: DC | PRN
Start: 2021-02-23 — End: 2021-02-26

## 2021-02-23 MED ORDER — ondansetron ODT (Zofran-ODT) disintegrating tablet 4 mg
4 | Freq: Three times a day (TID) | ORAL | Status: DC | PRN
Start: 2021-02-23 — End: 2021-02-26

## 2021-02-23 MED ORDER — miSOPROStoL (Cytotec) tablet 800 mcg
200 | Freq: Once | ORAL | Status: DC | PRN
Start: 2021-02-23 — End: 2021-02-26

## 2021-02-23 MED ORDER — lanolin (Lansinoh) 100 % cream 1 application
100 | TOPICAL | Status: DC | PRN
Start: 2021-02-23 — End: 2021-02-26
  Administered 2021-02-24: 21:00:00 1 via TOPICAL

## 2021-02-23 MED ORDER — measles, mumps and rubella (MMR II) vaccine 0.5 mL
1000-12500 | Freq: Once | SUBCUTANEOUS | Status: DC | PRN
Start: 2021-02-23 — End: 2021-02-26

## 2021-02-23 MED ORDER — docusate sodium (Colace) capsule 100 mg
100 | Freq: Two times a day (BID) | ORAL | Status: DC
Start: 2021-02-23 — End: 2021-02-26
  Administered 2021-02-23 – 2021-02-26 (×7): 100 mg via ORAL

## 2021-02-23 MED ORDER — lactated Ringer's infusion
INTRAVENOUS | Status: DC
Start: 2021-02-23 — End: 2021-02-26
  Administered 2021-02-23: 07:00:00 125 mL/h via INTRAVENOUS

## 2021-02-23 MED ORDER — simethicone (Mylicon) chewable tablet 80 mg
80 | Freq: Four times a day (QID) | ORAL | Status: DC | PRN
Start: 2021-02-23 — End: 2021-02-26
  Administered 2021-02-24: 21:00:00 80 mg via ORAL

## 2021-02-23 MED ORDER — bisacodyl (Dulcolax) suppository 10 mg
10 | Freq: Every day | RECTAL | Status: DC | PRN
Start: 2021-02-23 — End: 2021-02-26

## 2021-02-23 MED ORDER — ibuprofen tablet 800 mg
800 | Freq: Three times a day (TID) | ORAL | Status: DC
Start: 2021-02-23 — End: 2021-02-26
  Administered 2021-02-24 – 2021-02-26 (×8): 800 mg via ORAL

## 2021-02-23 MED ORDER — tranexamic acid (Cyklokapron) 1,000 mg in sodium chloride 0.9 % 100 mL IVPB- MBP
1000 | Freq: Once | INTRAVENOUS | Status: DC | PRN
Start: 2021-02-23 — End: 2021-02-26

## 2021-02-23 MED ORDER — diphenhydrAMINE (BENADryl) capsule 50 mg
25 | Freq: Every evening | ORAL | Status: DC | PRN
Start: 2021-02-23 — End: 2021-02-26

## 2021-02-23 MED ORDER — ondansetron (Zofran) injection 4 mg
4 | Freq: Three times a day (TID) | INTRAMUSCULAR | Status: DC | PRN
Start: 2021-02-23 — End: 2021-02-26

## 2021-02-23 MED ORDER — ketorolac (Toradol) injection 30 mg
30 | Freq: Four times a day (QID) | INTRAMUSCULAR | Status: AC
Start: 2021-02-23 — End: 2021-02-23
  Administered 2021-02-23 (×3): 30 mg via INTRAVENOUS

## 2021-02-23 MED ORDER — acetaminophen (Tylenol) tablet 975 mg
325 | Freq: Three times a day (TID) | ORAL | Status: DC
Start: 2021-02-23 — End: 2021-02-26
  Administered 2021-02-23 – 2021-02-26 (×10): 975 mg via ORAL

## 2021-02-23 MED FILL — ACETAMINOPHEN 325 MG TABLET: 325 325 mg | ORAL | Qty: 3

## 2021-02-23 MED FILL — KETOROLAC 30 MG/ML (1 ML) INJECTION SOLUTION: 30 30 mg/mL (1 mL) | INTRAMUSCULAR | Qty: 1

## 2021-02-23 MED FILL — MORPHINE (PF) 1 MG/ML INJECTION SOLUTION: 1 1 mg/mL | INTRAMUSCULAR | Qty: 10

## 2021-02-23 MED FILL — CEFAZOLIN 2 GRAM SOLUTION FOR INJECTION: 2 2 gram | INTRAMUSCULAR | Qty: 2000

## 2021-02-23 MED FILL — TRANEXAMIC ACID 1,000 MG/10 ML (100 MG/ML) INTRAVENOUS SOLUTION: 1000 1,000 mg/10 mL (100 mg/mL) | INTRAVENOUS | Qty: 10

## 2021-02-23 MED FILL — METHYLERGONOVINE 0.2 MG/ML (1 ML) INJECTION SOLUTION: 0.2 0.2 mg/mL (1 mL) | INTRAMUSCULAR | Qty: 1

## 2021-02-23 MED FILL — OXYTOCIN IN LACTATED RINGERS 30 UNIT/500 ML INTRAVENOUS SOLUTION: 30 30 unit/500 mL | INTRAVENOUS | Qty: 500

## 2021-02-23 MED FILL — AZITHROMYCIN 500 MG INTRAVENOUS SOLUTION: 500 500 mg | INTRAVENOUS | Qty: 500

## 2021-02-23 MED FILL — MISOPROSTOL 200 MCG TABLET: 200 200 mcg | ORAL | Qty: 4

## 2021-02-23 MED FILL — CARBOPROST TROMETHAMINE 250 MCG/ML INTRAMUSCULAR SOLUTION: 250 250 mcg/mL | INTRAMUSCULAR | Qty: 1

## 2021-02-23 MED FILL — DOCUSATE SODIUM 100 MG CAPSULE: 100 100 mg | ORAL | Qty: 1

## 2021-02-23 NOTE — Progress Notes (Signed)
Post-operative C-Section Rounding       Subjective:     Patient reports adequate  pain control, tolerating regular diet. Due to void. RN will alert if unable.  + flatus, no  BM. Moderate lochia, ambulating well.  Denies HA/CP/SOB/N/V/fevers/chills/LE pain. breastfeeding       Objective:     Temp:  [36.4 C (97.6 F)-37.1 C (98.7 F)] 36.8 C (98.3 F)  Pulse:  [80-131] 80  Resp:  [16-24] 16  BP: (90-138)/(50-82) 98/58  General: NAD  Abdomen: Soft, appropriately  tender, fundus firm below u   Incision: c/d/i, with dressing in place  Extremities: Soft, NT      Pertinent labs:  Results for orders placed or performed during the hospital encounter of 02/21/21   SARS/FLU/RSV    Specimen: Nasopharyngeal; Mucosa   Result Value Ref Range    Influenza A RNA Not Detected Not Detected    Influenza B RNA Not Detected Not Detected    SARS-CoV-2 RNA PCR Not Detected Not Detected    Respiratory syncytial virus Not Detected Not Detected   Type and screen   Result Value Ref Range    ABORh A POS     Antibody Screen NEG    CBC w/ Differential   Result Value Ref Range    WBC 8.2 4.0 - 11.0 K/uL    RBC 4.18 3.70 - 5.20 M/uL    Hemoglobin 9.9 (L) 11.0 - 16.0 g/dL    Hematocrit 60.4 (L) 32.0 - 47.0 %    MCV 76.1 (L) 80.0 - 100.0 fL    MCH 23.7 (L) 26.0 - 34.0 pg    MCHC 31.1 31.0 - 37.0 g/dL    RDW-CV 54.0 98.1 - 19.1 %    RDW-SD 39.4 35.0 - 51.0 fL    Platelets 209 150 - 400 K/uL    MPV 10.8 9.1 - 12.4 fL    Neutrophil % 58.5 %    Lymphocyte % 26.0 %    Monocytes % 12.1 %    Eosinophils % 1.5 %    Basophils % 0.2 %    Immature Granulocytes % 1.7 %    NRBC % 0.2 (H) 0.0 - 0.0 %    Neutrophils Absolute 4.79 1.50 - 7.95 K/uL    Lymphocytes Absolute 2.13 0.70 - 4.00 K/uL    Monocytes Absolute 0.99 (H) 0.36 - 0.77 K/uL    Eosinophils Absolute 0.12 0.00 - 0.50 K/uL    Basophils Absolute 0.02 0.00 - 0.22 K/uL    Immature Granulocytes Absolute 0.14 (H) 0.00 - 0.10 K/uL    NRBC Absolute 0.02 0.00 - 2.00 K/uL   Hepatic function panel   Result  Value Ref Range    Albumin 3.0 (L) 3.2 - 5.0 g/dL    Bilirubin, total 0.3 0.2 - 1.2 mg/dL    Bilirubin, direct <4.7 0.0 - 0.5 mg/dL    Alkaline phosphatase 90 30 - 130 U/L    AST 47 (H) 6 - 42 U/L    ALT 27 0 - 55 U/L    Protein, total 7.4 6.0 - 8.4 g/dL   Basic metabolic panel   Result Value Ref Range    Sodium 140 135 - 146 mmol/L    Potassium 4.1 3.6 - 5.2 mmol/L    Chloride 108 98 - 110 mmol/L    CO2 (Bicarbonate) 26 20 - 32 mmol/L    Anion Gap 6 3 - 14 mmol/L    BUN 3 (L) 6 - 24 mg/dL  Creatinine 0.50 (L) 0.55 - 1.30 mg/dL    eGFRcr 213131 >=08>=60 MV/HQI/6.96E*9mL/min/1.85m*2    Glucose 69 (L) 70 - 110 mg/dL    Fasting? Unknown     Calcium 8.4 (L) 8.5 - 10.5 mg/dL   CBC w/ Differential   Result Value Ref Range    WBC 11.3 (H) 4.0 - 11.0 K/uL    RBC 3.23 (L) 3.70 - 5.20 M/uL    Hemoglobin 7.7 (L) 11.0 - 16.0 g/dL    Hematocrit 52.824.5 (L) 32.0 - 47.0 %    MCV 75.9 (L) 80.0 - 100.0 fL    MCH 23.8 (L) 26.0 - 34.0 pg    MCHC 31.4 31.0 - 37.0 g/dL    RDW-CV 41.314.6 (H) 24.411.5 - 14.5 %    RDW-SD 39.4 35.0 - 51.0 fL    Platelets 148 (L) 150 - 400 K/uL    MPV 10.0 9.1 - 12.4 fL    Neutrophil % 71.1 %    Lymphocyte % 19.0 %    Monocytes % 8.7 %    Eosinophils % 0.5 %    Basophils % 0.2 %    Immature Granulocytes % 0.5 %    NRBC % 0.2 (H) 0.0 - 0.0 %    Neutrophils Absolute 8.00 (H) 1.50 - 7.95 K/uL    Lymphocytes Absolute 2.14 0.70 - 4.00 K/uL    Monocytes Absolute 0.98 (H) 0.36 - 0.77 K/uL    Eosinophils Absolute 0.06 0.00 - 0.50 K/uL    Basophils Absolute 0.02 0.00 - 0.22 K/uL    Immature Granulocytes Absolute 0.06 0.00 - 0.10 K/uL    NRBC Absolute 0.02 0.00 - 2.00 K/uL         Assessment:     31 y.o. [id number] postoperative day # 1 from a primary low transversecesarean section. Primary CS for NRFHT's    Plan:     - encourage ambulation  - routine post operative care  - lacatation support         Sign  Wynonia HazardMaureen Delle Andrzejewski, CNM  02/23/2021  12:34 PM

## 2021-02-23 NOTE — Care Plan (Signed)
Problem: Adult Inpatient Plan of Care  Goal: Plan of Care Review  Outcome: Ongoing, Progressing  Goal: Patient-Specific Goal (Individualized)  Outcome: Ongoing, Progressing  Goal: Absence of Hospital-Acquired Illness or Injury  Outcome: Ongoing, Progressing  Intervention: Prevent Skin Injury  Recent Flowsheet Documentation  Taken 02/23/2021 0030 by Vernell BarrierAlyson Verble Styron, RN  Body Position: supine  Intervention: Prevent and Manage VTE (Venous Thromboembolism) Risk  Recent Flowsheet Documentation  Taken 02/23/2021 0030 by Vernell BarrierAlyson Philo Kurtz, RN  Activity Management:   activity adjusted per tolerance   bedrest  Goal: Optimal Comfort and Wellbeing  Outcome: Ongoing, Progressing  Goal: Readiness for Transition of Care  Outcome: Ongoing, Progressing     Problem: Adjustment to Role Transition (Postpartum Cesarean Delivery)  Goal: Successful Maternal Role Transition  Outcome: Ongoing, Progressing  Intervention: Support Maternal Role Transition  Flowsheets (Taken 02/23/2021 0206)  Supportive Measures:   active listening utilized   counseling provided   decision-making supported  Parent/Child Attachment Promotion:   rooming-in promoted   parent/caregiver presence encouraged   positive reinforcement provided     Problem: Bleeding (Postpartum Cesarean Delivery)  Goal: Hemostasis  Outcome: Ongoing, Progressing     Problem: Infection (Postpartum Cesarean Delivery)  Goal: Absence of Infection Signs and Symptoms  Outcome: Ongoing, Progressing     Problem: Pain (Postpartum Cesarean Delivery)  Goal: Acceptable Pain Control  Outcome: Ongoing, Progressing     Problem: Postoperative Nausea and Vomiting (Postpartum Cesarean Delivery)  Goal: Nausea and Vomiting Relief  Outcome: Ongoing, Progressing  Intervention: Prevent or Manage Postoperative Nausea and Vomiting  Flowsheets (Taken 02/23/2021 0206)  Nausea/Vomiting Interventions:   sips of clear liquids given   stimuli minimized     Problem: Postoperative Urinary Retention (Postpartum Cesarean  Delivery)  Goal: Effective Urinary Elimination  Outcome: Ongoing, Progressing  Intervention: Monitor and Manage Urinary Retention  Flowsheets (Taken 02/23/2021 0206)  Urinary Elimination Promotion: catheter patency maintained     Problem: Breastfeeding  Goal: Effective Breastfeeding  Outcome: Ongoing, Progressing  Intervention: Promote Effective Breastfeeding  Flowsheets (Taken 02/23/2021 0206)  Breastfeeding Assistance:   feeding cue recognition promoted   feeding on demand promoted   infant stimulated to wakeful state  Parent/Child Attachment Promotion:   rooming-in promoted   parent/caregiver presence encouraged   positive reinforcement provided  Intervention: Support Exclusive Breastfeeding Success  Flowsheets (Taken 02/23/2021 0206)  Supportive Measures:   active listening utilized   counseling provided   decision-making supported   Goal Outcome Evaluation:

## 2021-02-23 NOTE — Lactation Note (Signed)
 This note was copied from a baby's chart.  Mom's 3rd baby.  Mom had some issues breastfeeding her other 2 children.  The first one was premature and the second one she had milk supply issues.  This baby has been BFW and was on the breast when I entered the room and doing well. Teaching reviewed with mom.  Mom encouraged to call for support as needed.

## 2021-02-23 NOTE — Anesthesia Post-Procedure Evaluation (Signed)
Patient: Sophia Brewer    Procedure Summary     Date: 02/22/21 Room / Location: [facility] L+D OR 01 / [facility] Labor and Delivery    Anesthesia Start: 1512 Anesthesia Stop: 2135    Procedure: Cesarean Section, Primary Diagnosis:     Surgeons: Randell Loop, MD Responsible Provider: Leslye Peer, MD    Anesthesia Type: MAC ASA Status: 2          Anesthesia Type: MAC    Vitals Value Taken Time   BP 111/56 02/23/21 0000   Temp 36.9 C (98.4 F) 02/22/21 2145   Pulse 97 02/23/21 0004   Resp 20 02/22/21 2345   SpO2 99 % 02/23/21 0004   Vitals shown include unvalidated device data.    Anesthesia Post Evaluation Note:    Patient location during evaluation: PACU  Patient participation: able to participate  Level of consciousness: arousable  Cardiovascular and Hydration status: stable  Respiratory Status Stable and Airway Patent: yes  Nausea and Vomiting Control Satisfactory: yes  Pain management: adequate     Vitals reviewed: yes  Unplanned ICU Admission: no      No notable events documented.

## 2021-02-24 MED ORDER — polyethylene glycol (Glycolax) packet 17 g
17 | Freq: Every day | ORAL | Status: DC
Start: 2021-02-24 — End: 2021-02-25
  Administered 2021-02-24 – 2021-02-25 (×2): 17 g via ORAL

## 2021-02-24 MED ORDER — ferrous sulfate 325 (65 Fe) MG tablet 325 mg
325 | Freq: Every day | ORAL | Status: DC
Start: 2021-02-24 — End: 2021-02-25
  Administered 2021-02-24 – 2021-02-25 (×2): 325 mg via ORAL

## 2021-02-24 MED FILL — IBUPROFEN 800 MG TABLET: 800 800 mg | ORAL | Qty: 1

## 2021-02-24 MED FILL — SIMETHICONE 80 MG CHEWABLE TABLET: 80 80 mg | ORAL | Qty: 1

## 2021-02-24 MED FILL — DOCUSATE SODIUM 100 MG CAPSULE: 100 100 mg | ORAL | Qty: 1

## 2021-02-24 MED FILL — MODIFIED LANOLIN 100 % TOPICAL CREAM: 100 100 % | TOPICAL | Qty: 7

## 2021-02-24 MED FILL — ACETAMINOPHEN 325 MG TABLET: 325 325 mg | ORAL | Qty: 3

## 2021-02-24 MED FILL — POLYETHYLENE GLYCOL 3350 17 GRAM ORAL POWDER PACKET: 17 17 gram | ORAL | Qty: 1

## 2021-02-24 MED FILL — OXYCODONE 5 MG TABLET: 5 5 mg | ORAL | Qty: 1

## 2021-02-24 MED FILL — FERROUS SULFATE 325 MG (65 MG IRON) TABLET: 325 325 (65 Fe) MG | ORAL | Qty: 1

## 2021-02-24 NOTE — Care Plan (Signed)
Problem: Adult Inpatient Plan of Care  Goal: Absence of Hospital-Acquired Illness or Injury  Intervention: Prevent Skin Injury  Recent Flowsheet Documentation  Taken 02/23/2021 2300 by Les Pou, RN  Body Position: position changed independently  Intervention: Prevent and Manage VTE (Venous Thromboembolism) Risk  Recent Flowsheet Documentation  Taken 02/23/2021 2300 by Les Pou, RN  Activity Management: up ad lib  Goal: Optimal Comfort and Wellbeing  Intervention: Monitor Pain and Promote Comfort  Recent Flowsheet Documentation  Taken 02/23/2021 2300 by Les Pou, RN  Pain Management Interventions:   around-the-clock dosing utilized   quiet environment facilitated  Taken 02/23/2021 2156 by Les Pou, RN  Pain Management Interventions:   around-the-clock dosing utilized   quiet environment facilitated     Problem: Pain (Postpartum Cesarean Delivery)  Goal: Acceptable Pain Control  Intervention: Prevent or Manage Pain  Recent Flowsheet Documentation  Taken 02/23/2021 2300 by Les Pou, RN  Pain Management Interventions:   around-the-clock dosing utilized   quiet environment facilitated  Taken 02/23/2021 2156 by Les Pou, RN  Pain Management Interventions:   around-the-clock dosing utilized   quiet environment facilitated   Goal Outcome Evaluation:

## 2021-02-24 NOTE — Progress Notes (Signed)
 Post-operative C-Section Rounding       Subjective:     Patient reports adequate pain control, tolerating regular diet. QS voiding, + flatus, - BM. Moderate lochia, ambulating well.  Denies HA/CP/SOB/N/V/fevers/chills/LE pain. breastfeeding.  Disc anemia (9.9/31.8 ---> 7.7/24.5)  Pt reports feeling stable except for slight minimal lightheadedness while showering.  Received Venofer X 1 on 02/20/21, prior to admission.  No IV access currently.  Will do PO iron.  Has not passed stool and feeling constipated.  Wondering if addition of iron would contribute to further constipation.  Disc use of miralax and ordered.       Objective:     Temp:  [36.6 C (97.9 F)-36.8 C (98.2 F)] 36.8 C (98.2 F)  Pulse:  [87-92] 87  Resp:  [16-18] 18  BP: (97-103)/(57-69) 100/57  General: NAD  Abdomen: Soft, non tender, fundus firm below u   Incision: c/d/i, with glue  Extremities: Soft, NT      Pertinent labs:  Results for orders placed or performed during the hospital encounter of 02/21/21   SARS/FLU/RSV    Specimen: Nasopharyngeal; Mucosa   Result Value Ref Range    Influenza A RNA Not Detected Not Detected    Influenza B RNA Not Detected Not Detected    SARS-CoV-2 RNA PCR Not Detected Not Detected    Respiratory syncytial virus Not Detected Not Detected   Type and screen   Result Value Ref Range    ABORh A POS     Antibody Screen NEG    CBC w/ Differential   Result Value Ref Range    WBC 8.2 4.0 - 11.0 K/uL    RBC 4.18 3.70 - 5.20 M/uL    Hemoglobin 9.9 (L) 11.0 - 16.0 g/dL    Hematocrit 41.3 (L) 32.0 - 47.0 %    MCV 76.1 (L) 80.0 - 100.0 fL    MCH 23.7 (L) 26.0 - 34.0 pg    MCHC 31.1 31.0 - 37.0 g/dL    RDW-CV 24.4 01.0 - 27.2 %    RDW-SD 39.4 35.0 - 51.0 fL    Platelets 209 150 - 400 K/uL    MPV 10.8 9.1 - 12.4 fL    Neutrophil % 58.5 %    Lymphocyte % 26.0 %    Monocytes % 12.1 %    Eosinophils % 1.5 %    Basophils % 0.2 %    Immature Granulocytes % 1.7 %    NRBC % 0.2 (H) 0.0 - 0.0 %    Neutrophils Absolute 4.79 1.50 - 7.95 K/uL     Lymphocytes Absolute 2.13 0.70 - 4.00 K/uL    Monocytes Absolute 0.99 (H) 0.36 - 0.77 K/uL    Eosinophils Absolute 0.12 0.00 - 0.50 K/uL    Basophils Absolute 0.02 0.00 - 0.22 K/uL    Immature Granulocytes Absolute 0.14 (H) 0.00 - 0.10 K/uL    NRBC Absolute 0.02 0.00 - 2.00 K/uL   Hepatic function panel   Result Value Ref Range    Albumin 3.0 (L) 3.2 - 5.0 g/dL    Bilirubin, total 0.3 0.2 - 1.2 mg/dL    Bilirubin, direct <5.3 0.0 - 0.5 mg/dL    Alkaline phosphatase 90 30 - 130 U/L    AST 47 (H) 6 - 42 U/L    ALT 27 0 - 55 U/L    Protein, total 7.4 6.0 - 8.4 g/dL   Basic metabolic panel   Result Value Ref Range    Sodium 140 135 -  146 mmol/L    Potassium 4.1 3.6 - 5.2 mmol/L    Chloride 108 98 - 110 mmol/L    CO2 (Bicarbonate) 26 20 - 32 mmol/L    Anion Gap 6 3 - 14 mmol/L    BUN 3 (L) 6 - 24 mg/dL    Creatinine 6.57 (L) 0.55 - 1.30 mg/dL    eGFRcr 846 >=96 EX/BMW/4.13K*4    Glucose 69 (L) 70 - 110 mg/dL    Fasting? Unknown     Calcium 8.4 (L) 8.5 - 10.5 mg/dL   CBC w/ Differential   Result Value Ref Range    WBC 11.3 (H) 4.0 - 11.0 K/uL    RBC 3.23 (L) 3.70 - 5.20 M/uL    Hemoglobin 7.7 (L) 11.0 - 16.0 g/dL    Hematocrit 40.1 (L) 32.0 - 47.0 %    MCV 75.9 (L) 80.0 - 100.0 fL    MCH 23.8 (L) 26.0 - 34.0 pg    MCHC 31.4 31.0 - 37.0 g/dL    RDW-CV 02.7 (H) 25.3 - 14.5 %    RDW-SD 39.4 35.0 - 51.0 fL    Platelets 148 (L) 150 - 400 K/uL    MPV 10.0 9.1 - 12.4 fL    Neutrophil % 71.1 %    Lymphocyte % 19.0 %    Monocytes % 8.7 %    Eosinophils % 0.5 %    Basophils % 0.2 %    Immature Granulocytes % 0.5 %    NRBC % 0.2 (H) 0.0 - 0.0 %    Neutrophils Absolute 8.00 (H) 1.50 - 7.95 K/uL    Lymphocytes Absolute 2.14 0.70 - 4.00 K/uL    Monocytes Absolute 0.98 (H) 0.36 - 0.77 K/uL    Eosinophils Absolute 0.06 0.00 - 0.50 K/uL    Basophils Absolute 0.02 0.00 - 0.22 K/uL    Immature Granulocytes Absolute 0.06 0.00 - 0.10 K/uL    NRBC Absolute 0.02 0.00 - 2.00 K/uL         Assessment:     31 y.o. [id number] postoperative day # 2  from a primary low transversecesarean section for fetal intolerance to labor and FTP. Anemia as noted above    Plan:     - encourage ambulation  - routine post operative care  - po iron      Sign  Laveda Abbe, CNM  02/24/2021  2:35 PM

## 2021-02-24 NOTE — Care Plan (Signed)
Problem: Adult Inpatient Plan of Care  Goal: Absence of Hospital-Acquired Illness or Injury  Intervention: Prevent Skin Injury  Recent Flowsheet Documentation  Taken 02/24/2021 0830 by Sid FalconBriana Keiron Iodice, RN  Body Position: position changed independently  Intervention: Prevent and Manage VTE (Venous Thromboembolism) Risk  Recent Flowsheet Documentation  Taken 02/24/2021 0830 by Sid FalconBriana Ramy Greth, RN  Activity Management: up ad lib  Goal: Optimal Comfort and Wellbeing  Intervention: Monitor Pain and Promote Comfort  Recent Flowsheet Documentation  Taken 02/24/2021 0830 by Sid FalconBriana Karren Newland, RN  Pain Management Interventions: around-the-clock dosing utilized     Problem: Change in Fetal Wellbeing (Labor)  Goal: Stable Fetal Wellbeing  Intervention: Promote and Monitor Fetal Wellbeing  Recent Flowsheet Documentation  Taken 02/24/2021 0830 by Sid FalconBriana Rosalynd Mcwright, RN  Body Position: position changed independently     Problem: Pain (Postpartum Cesarean Delivery)  Goal: Acceptable Pain Control  Intervention: Prevent or Manage Pain  Recent Flowsheet Documentation  Taken 02/24/2021 0830 by Sid FalconBriana Raylee Strehl, RN  Pain Management Interventions: around-the-clock dosing utilized     Problem: Adjustment to Role Transition (Postpartum Cesarean Delivery)  Goal: Successful Maternal Role Transition  Outcome: Ongoing, Progressing     Problem: Bleeding (Postpartum Cesarean Delivery)  Goal: Hemostasis  Outcome: Ongoing, Progressing     Problem: Infection (Postpartum Cesarean Delivery)  Goal: Absence of Infection Signs and Symptoms  Outcome: Ongoing, Progressing     Problem: Pain (Postpartum Cesarean Delivery)  Goal: Acceptable Pain Control  Outcome: Ongoing, Progressing  Intervention: Prevent or Manage Pain  Recent Flowsheet Documentation  Taken 02/24/2021 0830 by Sid FalconBriana Sherel Fennell, RN  Pain Management Interventions: around-the-clock dosing utilized     Problem: Postoperative Nausea and Vomiting (Postpartum Cesarean Delivery)  Goal: Nausea and Vomiting  Relief  Outcome: Ongoing, Progressing     Problem: Postoperative Urinary Retention (Postpartum Cesarean Delivery)  Goal: Effective Urinary Elimination  Outcome: Ongoing, Progressing   Goal Outcome Evaluation:

## 2021-02-24 NOTE — Nursing Note (Signed)
Patient requested electric breast pump. Advised patient to breastfeed q 2 to 3 hours then pump and supplement with ebm. Will continue to monitor.

## 2021-02-24 NOTE — Lactation Note (Signed)
 This note was copied from a baby's chart.  Mom states baby has been bfw and cluster fed overnight and is sleepy now. Baby is skin to skin with mom and mom just attempted at breast and does not want to disturb him. Encouraged to call for assistance as needed.

## 2021-02-25 MED ORDER — ferrous sulfate 325 (65 Fe) MG tablet 325 mg
325 | Freq: Every day | ORAL | Status: DC
Start: 2021-02-25 — End: 2021-02-26
  Administered 2021-02-26: 15:00:00 325 mg via ORAL

## 2021-02-25 MED ORDER — polyethylene glycol (Glycolax) packet 17 g
17 | Freq: Two times a day (BID) | ORAL | Status: DC
Start: 2021-02-25 — End: 2021-02-26
  Administered 2021-02-26 (×2): 17 g via ORAL

## 2021-02-25 MED FILL — ACETAMINOPHEN 325 MG TABLET: 325 325 mg | ORAL | Qty: 3

## 2021-02-25 MED FILL — DOCUSATE SODIUM 100 MG CAPSULE: 100 100 mg | ORAL | Qty: 1

## 2021-02-25 MED FILL — OXYCODONE 5 MG TABLET: 5 5 mg | ORAL | Qty: 1

## 2021-02-25 MED FILL — IBUPROFEN 800 MG TABLET: 800 800 mg | ORAL | Qty: 1

## 2021-02-25 MED FILL — FERROUS SULFATE 325 MG (65 MG IRON) TABLET: 325 325 (65 Fe) MG | ORAL | Qty: 1

## 2021-02-25 MED FILL — POLYETHYLENE GLYCOL 3350 17 GRAM ORAL POWDER PACKET: 17 17 gram | ORAL | Qty: 1

## 2021-02-25 NOTE — Care Plan (Signed)
Problem: Adult Inpatient Plan of Care  Goal: Absence of Hospital-Acquired Illness or Injury  Intervention: Prevent Skin Injury  Recent Flowsheet Documentation  Taken 02/25/2021 0800 by Sid Falcon, RN  Body Position: position changed independently  Intervention: Prevent and Manage VTE (Venous Thromboembolism) Risk  Recent Flowsheet Documentation  Taken 02/25/2021 0800 by Sid Falcon, RN  Activity Management: up ad lib  Goal: Optimal Comfort and Wellbeing  Intervention: Monitor Pain and Promote Comfort  Recent Flowsheet Documentation  Taken 02/25/2021 0800 by Sid Falcon, RN  Pain Management Interventions: around-the-clock dosing utilized     Problem: Change in Fetal Wellbeing (Labor)  Goal: Stable Fetal Wellbeing  Intervention: Promote and Monitor Fetal Wellbeing  Recent Flowsheet Documentation  Taken 02/25/2021 0800 by Sid Falcon, RN  Body Position: position changed independently     Problem: Pain (Postpartum Cesarean Delivery)  Goal: Acceptable Pain Control  Intervention: Prevent or Manage Pain  Recent Flowsheet Documentation  Taken 02/25/2021 0800 by Sid Falcon, RN  Pain Management Interventions: around-the-clock dosing utilized     Problem: Adjustment to Role Transition (Postpartum Cesarean Delivery)  Goal: Successful Maternal Role Transition  Outcome: Ongoing, Progressing     Problem: Bleeding (Postpartum Cesarean Delivery)  Goal: Hemostasis  Outcome: Ongoing, Progressing     Problem: Infection (Postpartum Cesarean Delivery)  Goal: Absence of Infection Signs and Symptoms  Outcome: Ongoing, Progressing     Problem: Pain (Postpartum Cesarean Delivery)  Goal: Acceptable Pain Control  Outcome: Ongoing, Progressing  Intervention: Prevent or Manage Pain  Recent Flowsheet Documentation  Taken 02/25/2021 0800 by Sid Falcon, RN  Pain Management Interventions: around-the-clock dosing utilized     Problem: Postoperative Nausea and Vomiting (Postpartum Cesarean Delivery)  Goal: Nausea and Vomiting  Relief  Outcome: Ongoing, Progressing     Problem: Postoperative Urinary Retention (Postpartum Cesarean Delivery)  Goal: Effective Urinary Elimination  Outcome: Ongoing, Progressing   Goal Outcome Evaluation:

## 2021-02-25 NOTE — Progress Notes (Signed)
Post-operative C-Section Rounding       Subjective:     Patient reports good pain control, tolerating regular diet. Freely voiding without sx retention, passing flatus, no BM. Moderate lochia, ambulating independently.  Denies HA/CP/SOB/N/V/fevers/chills/LE pain. Breastfeeding       Objective:     Temp:  [36.5 C (97.7 F)-37.1 C (98.7 F)] 36.5 C (97.7 F)  Pulse:  [75-101] 75  Resp:  [16-18] 16  BP: (103-106)/(50-64) 103/56  General: NAD  Abdomen: Soft, non tender, fundus firm below u   Incision: c/d/i, with glue  Extremities: Soft, NT      Pertinent labs:  Admission on 02/21/2021   Component Date Value Ref Range Status   . ABORh 02/21/2021 A POS   Final   . Antibody Screen 02/21/2021 NEG   Final   . Influenza A RNA 02/21/2021 Not Detected  Not Detected Final   . Influenza B RNA 02/21/2021 Not Detected  Not Detected Final   . SARS-CoV-2 RNA PCR 02/21/2021 Not Detected  Not Detected Final   . Respiratory syncytial virus 02/21/2021 Not Detected  Not Detected Final   . WBC 02/21/2021 8.2  4.0 - 11.0 K/uL Final   . RBC 02/21/2021 4.18  3.70 - 5.20 M/uL Final   . Hemoglobin 02/21/2021 9.9 (L)  11.0 - 16.0 g/dL Final   . Hematocrit 86/57/8469 31.8 (L)  32.0 - 47.0 % Final   . MCV 02/21/2021 76.1 (L)  80.0 - 100.0 fL Final   . MCH 02/21/2021 23.7 (L)  26.0 - 34.0 pg Final   . MCHC 02/21/2021 31.1  31.0 - 37.0 g/dL Final   . RDW-CV 62/95/2841 14.5  11.5 - 14.5 % Final   . RDW-SD 02/21/2021 39.4  35.0 - 51.0 fL Final   . Platelets 02/21/2021 209  150 - 400 K/uL Final   . MPV 02/21/2021 10.8  9.1 - 12.4 fL Final   . Neutrophil % 02/21/2021 58.5  % Final   . Lymphocyte % 02/21/2021 26.0  % Final   . Monocytes % 02/21/2021 12.1  % Final   . Eosinophils % 02/21/2021 1.5  % Final   . Basophils % 02/21/2021 0.2  % Final   . Immature Granulocytes % 02/21/2021 1.7  % Final   . NRBC % 02/21/2021 0.2 (H)  0.0 - 0.0 % Final   . Neutrophils Absolute 02/21/2021 4.79  1.50 - 7.95 K/uL Final   . Lymphocytes Absolute 02/21/2021 2.13   0.70 - 4.00 K/uL Final   . Monocytes Absolute 02/21/2021 0.99 (H)  0.36 - 0.77 K/uL Final   . Eosinophils Absolute 02/21/2021 0.12  0.00 - 0.50 K/uL Final   . Basophils Absolute 02/21/2021 0.02  0.00 - 0.22 K/uL Final   . Immature Granulocytes Absolute 02/21/2021 0.14 (H)  0.00 - 0.10 K/uL Final   . NRBC Absolute 02/21/2021 0.02  0.00 - 2.00 K/uL Final   . Albumin 02/21/2021 3.0 (L)  3.2 - 5.0 g/dL Final   . Bilirubin, total 02/21/2021 0.3  0.2 - 1.2 mg/dL Final   . Bilirubin, direct 02/21/2021 <0.1  0.0 - 0.5 mg/dL Final    Hemolyzed, may decrease result   . Alkaline phosphatase 02/21/2021 90  30 - 130 U/L Final   . AST 02/21/2021 47 (H)  6 - 42 U/L Final    Hemolyzed, may increase result   . ALT 02/21/2021 27  0 - 55 U/L Final   . Protein, total 02/21/2021 7.4  6.0 - 8.4 g/dL  Final   . Sodium 02/23/2021 140  135 - 146 mmol/L Final   . Potassium 02/23/2021 4.1  3.6 - 5.2 mmol/L Final   . Chloride 02/23/2021 108  98 - 110 mmol/L Final   . CO2 (Bicarbonate) 02/23/2021 26  20 - 32 mmol/L Final   . Anion Gap 02/23/2021 6  3 - 14 mmol/L Final   . BUN 02/23/2021 3 (L)  6 - 24 mg/dL Final   . Creatinine 16/11/9602 0.50 (L)  0.55 - 1.30 mg/dL Final   . eGFRcr 54/10/8117 131  >=60 mL/min/1.3m*2 Final   . Glucose 02/23/2021 69 (L)  70 - 110 mg/dL Final   . Fasting? 14/78/2956 Unknown   Final   . Calcium 02/23/2021 8.4 (L)  8.5 - 10.5 mg/dL Final   . WBC 21/30/8657 11.3 (H)  4.0 - 11.0 K/uL Final   . RBC 02/23/2021 3.23 (L)  3.70 - 5.20 M/uL Final   . Hemoglobin 02/23/2021 7.7 (L)  11.0 - 16.0 g/dL Final   . Hematocrit 84/69/6295 24.5 (L)  32.0 - 47.0 % Final   . MCV 02/23/2021 75.9 (L)  80.0 - 100.0 fL Final   . MCH 02/23/2021 23.8 (L)  26.0 - 34.0 pg Final   . MCHC 02/23/2021 31.4  31.0 - 37.0 g/dL Final   . RDW-CV 28/41/3244 14.6 (H)  11.5 - 14.5 % Final   . RDW-SD 02/23/2021 39.4  35.0 - 51.0 fL Final   . Platelets 02/23/2021 148 (L)  150 - 400 K/uL Final   . MPV 02/23/2021 10.0  9.1 - 12.4 fL Final   . Neutrophil %  02/23/2021 71.1  % Final   . Lymphocyte % 02/23/2021 19.0  % Final   . Monocytes % 02/23/2021 8.7  % Final   . Eosinophils % 02/23/2021 0.5  % Final   . Basophils % 02/23/2021 0.2  % Final   . Immature Granulocytes % 02/23/2021 0.5  % Final   . NRBC % 02/23/2021 0.2 (H)  0.0 - 0.0 % Final   . Neutrophils Absolute 02/23/2021 8.00 (H)  1.50 - 7.95 K/uL Final   . Lymphocytes Absolute 02/23/2021 2.14  0.70 - 4.00 K/uL Final   . Monocytes Absolute 02/23/2021 0.98 (H)  0.36 - 0.77 K/uL Final   . Eosinophils Absolute 02/23/2021 0.06  0.00 - 0.50 K/uL Final   . Basophils Absolute 02/23/2021 0.02  0.00 - 0.22 K/uL Final   . Immature Granulocytes Absolute 02/23/2021 0.06  0.00 - 0.10 K/uL Final   . NRBC Absolute 02/23/2021 0.02  0.00 - 2.00 K/uL Final        Assessment:     31 y.o. [id number] postoperative day # 3 from a primary low transversecesarean section. Pt states she is feeling well. Still no BM. Can increase Miralax to BID. Will increase fluid and fiber. Afebrile. No sx VTE. Pain in good control. Feeding infant independently. Wants dc tomorrow    Plan:     - encourage ambulation  - routine post operative care        Sign  Sander Nephew, CNM  02/25/2021  10:33 AM

## 2021-02-25 NOTE — Lactation Note (Signed)
This note was copied from a baby's chart.  Mom reports baby is bfw and she is feeling changes in her breast. Will call for support as needed.

## 2021-02-25 NOTE — Care Plan (Signed)
Problem: Adjustment to Role Transition (Postpartum Cesarean Delivery)  Goal: Successful Maternal Role Transition  Outcome: Ongoing, Progressing     Problem: Bleeding (Postpartum Cesarean Delivery)  Goal: Hemostasis  Outcome: Ongoing, Progressing     Problem: Infection (Postpartum Cesarean Delivery)  Goal: Absence of Infection Signs and Symptoms  Outcome: Ongoing, Progressing     Problem: Postoperative Nausea and Vomiting (Postpartum Cesarean Delivery)  Goal: Nausea and Vomiting Relief  Outcome: Ongoing, Progressing     Problem: Postoperative Urinary Retention (Postpartum Cesarean Delivery)  Goal: Effective Urinary Elimination  Outcome: Ongoing, Progressing   Goal Outcome Evaluation:

## 2021-02-26 ENCOUNTER — Encounter (HOSPITAL_BASED_OUTPATIENT_CLINIC_OR_DEPARTMENT_OTHER): Admitting: Advanced Practice Midwife

## 2021-02-26 MED ORDER — oxyCODONE (Roxicodone) 5 mg immediate release tablet
5 | ORAL_TABLET | ORAL | 0 refills | 8.00000 days | Status: AC | PRN
Start: 2021-02-26 — End: 2021-03-02

## 2021-02-26 MED ORDER — ibuprofen 800 mg tablet
800 | ORAL_TABLET | Freq: Three times a day (TID) | ORAL | 0 refills | Status: AC | PRN
Start: 2021-02-26 — End: 2021-03-08

## 2021-02-26 MED ORDER — acetaminophen (Tylenol) 325 mg tablet
325 | ORAL_TABLET | Freq: Three times a day (TID) | ORAL | 0 refills | 8.00000 days | Status: AC
Start: 2021-02-26 — End: 2021-03-08

## 2021-02-26 MED FILL — IBUPROFEN 800 MG TABLET: 800 800 mg | ORAL | Qty: 1

## 2021-02-26 MED FILL — OXYCODONE 5 MG TABLET: 5 5 mg | ORAL | Qty: 1

## 2021-02-26 MED FILL — ACETAMINOPHEN 325 MG TABLET: 325 325 mg | ORAL | Qty: 3

## 2021-02-26 MED FILL — POLYETHYLENE GLYCOL 3350 17 GRAM ORAL POWDER PACKET: 17 17 gram | ORAL | Qty: 1

## 2021-02-26 MED FILL — DOCUSATE SODIUM 100 MG CAPSULE: 100 100 mg | ORAL | Qty: 1

## 2021-02-26 MED FILL — FERROUS SULFATE 325 MG (65 MG IRON) TABLET: 325 325 (65 Fe) MG | ORAL | Qty: 1

## 2021-02-26 NOTE — Other (Signed)
Patient Education   Table of Contents       Postpartum Baby Blues     To view videos and all your education online visit,   [email or web address]   or scan this QR code with your smartphone.                    Postpartum Baby Blues     The postpartum period begins right after the birth of a baby. During this time, there is often joy and excitement. It is also a time of many changes in the life of the parents. A mother may feel happy one minute and sad or stressed the next. These feelings of sadness, called the baby blues, usually happen in the period right after the baby is born and go away within a week or two.   What are the causes?   The exact cause of this condition is not known. Changes in hormone levels after childbirth are believed to trigger some of the symptoms.    Other factors that can play a role in these mood changes include:       Lack of sleep.       Stressful life events, such as financial problems, caring for a loved one, or death of a loved one.       Genetics.     What are the signs or symptoms?    Symptoms of this condition include:       Changes in mood, such as going from extreme happiness to sadness.       A decrease in concentration.       Difficulty sleeping.       Crying spells and tearfulness.       Loss of appetite.       Irritability.       Anxiety.     If these symptoms last for more than 2 weeks or become more severe, you may have postpartum depression.   How is this diagnosed?   This condition is diagnosed based on an evaluation of your symptoms. Your health care provider may use a screening tool that includes a list of questions to help identify a person with the baby blues or postpartum depression.   How is this treated?   The baby blues usually go away on their own in 1?2 weeks. Social support is often what is needed. You will be encouraged to get adequate sleep and rest.   Follow these instructions at home:   Lifestyle               Get as much rest as you can. Take a  nap when the baby sleeps.       Exercise regularly as told by your health care provider. Some women find yoga and walking to be helpful.       Eat a balanced and nourishing diet. This includes plenty of fruits and vegetables, whole grains, and lean proteins.       Do little things that you enjoy. Take a bubble bath, read your favorite magazine, or listen to your favorite music.       Avoid alcohol.       Ask for help with household chores, cooking, grocery shopping, or running errands. Do not  try to do everything yourself. Consider hiring a postpartum doula to help. This is a professional who specializes in providing support to new mothers.       Try not to make any major life changes during  pregnancy or right after giving birth. This can add stress.       General instructions         Talk to people close to you about how you are feeling. Get support from your partner, family members, friends, or other new moms. You may want to join a support group.      Find ways to manage stress. This may include:       Writing your thoughts and feelings in a journal.       Spending time outside.       Spending time with people who make you laugh.       Try to stay positive in how you think. Think about the things you are grateful for.       Take over-the-counter and prescription medicines only as told by your health care provider.       Let your health care provider know if you have any concerns.       Keep all postpartum visits. This is important.       Contact a health care provider if:         Your baby blues do not go away after 2 weeks.     Get help right away if:         You have thoughts of taking your own life (suicidal thoughts), or of harming your baby or someone else.       You see or hear things that are not there (hallucinations).     If you ever feel like you may hurt yourself or others, or have thoughts about taking your own life, get help right away. Go to your nearest emergency department or:      Call your  local emergency services (911 in the [place]      Call a suicide crisis helpline, such as the National Suicide Prevention Lifeline, at (938) 518-03331-719-484-2646 or 988 in the [place] This is open 24 hours a day in the [place]      Text the Crisis Text Line at 401-112-0217741741 (in the [place]     Summary         After giving birth, you may feel happy one minute and sad or stressed the next. Feelings of sadness that happen right after the baby is born and go away after a week or two are called the baby blues.       You can manage the baby blues by getting enough rest, eating a healthy diet, exercising, spending time with supportive people, and finding ways to manage stress.       If feelings of sadness and stress last longer than 2 weeks or get in the way of caring for your baby, talk with your health care provider. This may mean you have postpartum depression.     This information is not intended to replace advice given to you by your health care provider. Make sure you discuss any questions you have with your health care provider.     Document Released: 09/21/2005Document Revised: 07/13/2022Document Reviewed: 07/30/2019     [employer]vier Patient Education ? 2022 [employer]

## 2021-02-26 NOTE — Discharge Summary (Signed)
Post-operative C-Section Discharge Summary    Discharge Diagnoses: postpartum, cesarean delivery #1 for non reassuring FHT remote from delivery s/p IOL for cholestasis, chronic anemia complicated by acute blood loss post op anemia    Hospital Course:  Sophia Brewer is a 31 y.o. [id number] postoperative day # 4 from a primary low transverse cesarean section.     She presented to labor and delivery for IOL for cholestasis. Her cesarean delivery was for  non reassuring FHT and failure to progress .     Postpartum her course has been uncomplicated.    Patient reports good pain control, tolerating regular diet. Voiding, + flatus, - BM. Moderate lochia, ambulating without difficulty.    Denies CP/SOB/N/V/fevers/chills/LE pain. - Dizziness. Reports intermittent HA that resolves with motrin/tylenol. Does report LLE edema and voices concern.  breastfeeding    Objective    Temp:  [36.9 C (98.4 F)-37.3 C (99.2 F)] 36.9 C (98.4 F)  Pulse:  [71-97] 71  Resp:  [16-18] 18  BP: (100-118)/(67-83) 118/77  General: NAD  Lungs: NLB  CV: RRR  Breasts: filling, nipples intact  Abdomen: Soft, appropriately tender, fundus firm below u   Incision: c/d/i, with steri  Extremities: Soft, NT, mild non-pitting edema    MBT: A Pos  QBL: 393 mL    H/H 9.9/31.8 > 7.7/24.5    Pertinent labs:  Results for orders placed or performed during the hospital encounter of 02/21/21   SARS/FLU/RSV    Specimen: Nasopharyngeal; Mucosa   Result Value Ref Range    Influenza A RNA Not Detected Not Detected    Influenza B RNA Not Detected Not Detected    SARS-CoV-2 RNA PCR Not Detected Not Detected    Respiratory syncytial virus Not Detected Not Detected   Type and screen   Result Value Ref Range    ABORh A POS     Antibody Screen NEG    CBC w/ Differential   Result Value Ref Range    WBC 8.2 4.0 - 11.0 K/uL    RBC 4.18 3.70 - 5.20 M/uL    Hemoglobin 9.9 (L) 11.0 - 16.0 g/dL    Hematocrit 21.3 (L) 32.0 - 47.0 %    MCV 76.1 (L) 80.0 - 100.0 fL    MCH 23.7 (L) 26.0 -  34.0 pg    MCHC 31.1 31.0 - 37.0 g/dL    RDW-CV 08.6 57.8 - 46.9 %    RDW-SD 39.4 35.0 - 51.0 fL    Platelets 209 150 - 400 K/uL    MPV 10.8 9.1 - 12.4 fL    Neutrophil % 58.5 %    Lymphocyte % 26.0 %    Monocytes % 12.1 %    Eosinophils % 1.5 %    Basophils % 0.2 %    Immature Granulocytes % 1.7 %    NRBC % 0.2 (H) 0.0 - 0.0 %    Neutrophils Absolute 4.79 1.50 - 7.95 K/uL    Lymphocytes Absolute 2.13 0.70 - 4.00 K/uL    Monocytes Absolute 0.99 (H) 0.36 - 0.77 K/uL    Eosinophils Absolute 0.12 0.00 - 0.50 K/uL    Basophils Absolute 0.02 0.00 - 0.22 K/uL    Immature Granulocytes Absolute 0.14 (H) 0.00 - 0.10 K/uL    NRBC Absolute 0.02 0.00 - 2.00 K/uL   Hepatic function panel   Result Value Ref Range    Albumin 3.0 (L) 3.2 - 5.0 g/dL    Bilirubin, total 0.3 0.2 - 1.2 mg/dL  Bilirubin, direct <0.1 0.0 - 0.5 mg/dL    Alkaline phosphatase 90 30 - 130 U/L    AST 47 (H) 6 - 42 U/L    ALT 27 0 - 55 U/L    Protein, total 7.4 6.0 - 8.4 g/dL   Basic metabolic panel   Result Value Ref Range    Sodium 140 135 - 146 mmol/L    Potassium 4.1 3.6 - 5.2 mmol/L    Chloride 108 98 - 110 mmol/L    CO2 (Bicarbonate) 26 20 - 32 mmol/L    Anion Gap 6 3 - 14 mmol/L    BUN 3 (L) 6 - 24 mg/dL    Creatinine 1.61 (L) 0.55 - 1.30 mg/dL    eGFRcr 096 >=04 VW/UJW/1.19J*4    Glucose 69 (L) 70 - 110 mg/dL    Fasting? Unknown     Calcium 8.4 (L) 8.5 - 10.5 mg/dL   CBC w/ Differential   Result Value Ref Range    WBC 11.3 (H) 4.0 - 11.0 K/uL    RBC 3.23 (L) 3.70 - 5.20 M/uL    Hemoglobin 7.7 (L) 11.0 - 16.0 g/dL    Hematocrit 78.2 (L) 32.0 - 47.0 %    MCV 75.9 (L) 80.0 - 100.0 fL    MCH 23.8 (L) 26.0 - 34.0 pg    MCHC 31.4 31.0 - 37.0 g/dL    RDW-CV 95.6 (H) 21.3 - 14.5 %    RDW-SD 39.4 35.0 - 51.0 fL    Platelets 148 (L) 150 - 400 K/uL    MPV 10.0 9.1 - 12.4 fL    Neutrophil % 71.1 %    Lymphocyte % 19.0 %    Monocytes % 8.7 %    Eosinophils % 0.5 %    Basophils % 0.2 %    Immature Granulocytes % 0.5 %    NRBC % 0.2 (H) 0.0 - 0.0 %    Neutrophils  Absolute 8.00 (H) 1.50 - 7.95 K/uL    Lymphocytes Absolute 2.14 0.70 - 4.00 K/uL    Monocytes Absolute 0.98 (H) 0.36 - 0.77 K/uL    Eosinophils Absolute 0.06 0.00 - 0.50 K/uL    Basophils Absolute 0.02 0.00 - 0.22 K/uL    Immature Granulocytes Absolute 0.06 0.00 - 0.10 K/uL    NRBC Absolute 0.02 0.00 - 2.00 K/uL         Discharge Plan:  - Discharge instructions and medications reviewed and patient stable for discharge home.  - Routine post operative care  - PP blues and depression reviewed   - reassured re: HA since it resolves with medication, encouraged compression socks for edema   - Patient to follow up with [facility] in 2 + 6  weeks  - Instructed to call with bleeding > 1 pad / hour, fever, pain uncontrolled by pain medication, or symptoms of new or worsening depression.  - Patient instructed nothing in the vagina for 6 weeks.     Sign  Cassie Freer, CNM  02/26/2021  9:22 AM

## 2021-02-26 NOTE — Other (Signed)
Patient Education   Table of Contents       Postpartum Care After Vaginal Delivery     To view videos and all your education online visit,   [email or web address]   or scan this QR code with your smartphone.                    Postpartum Care After Vaginal Delivery     The following information offers guidance about how to care for yourself from the time you deliver your baby to 6?12 weeks after delivery (postpartum period). If you have problems or questions, contact your health care provider for more specific instructions.   Follow these instructions at home:   Vaginal bleeding        It is normal to have vaginal bleeding (lochia) after delivery. Wear a sanitary pad for bleeding and discharge.       During the first week after delivery, the amount and appearance of lochia is often similar to a menstrual period.       Over the next few weeks, it will gradually decrease to a dry, yellow-Griffy discharge.       For most women, lochia stops completely by 4?6 weeks after delivery, but can vary.      Change your sanitary pads frequently. Watch for any changes in your flow, such as:       A sudden increase in volume.       A change in color.      Large blood clots.       If you pass a blood clot from your vagina, save it and call your health care provider. Do not  flush blood clots down the toilet before talking with your health care provider.      Do not  use tampons or douches until your health care provider approves.       If you are not breastfeeding, your period should return 6?8 weeks after delivery. If you are feeding your baby breast milk only, your period may not return until you stop breastfeeding.     Perineal care            Keep the area between the vagina and the anus (perineum) clean and dry. Use medicated pads and pain-relieving sprays and creams as directed.      If you had a surgical cut in the perineum (episiotomy) or a tear, check the area for signs of infection until you are healed. Check  for:       More redness, swelling, or pain.       Fluid or blood coming from the cut or tear.       Warmth.       Pus or a bad smell.       You may be given a squirt bottle to use instead of wiping to clean the perineum area after you use the bathroom. Pat the area gently to dry it.       To relieve pain caused by an episiotomy, a tear, or swollen veins in the anus (hemorrhoids), take a warm sitz bath 2?3 times a day. In a sitz bath, the warm water should only come up to your hips and cover your buttocks.     Breast care         In the first few days after delivery, your breasts may feel heavy, full, and uncomfortable (breast engorgement). Milk may also leak from your breasts. Ask your health care provider about ways  to help relieve the discomfort.      If you are breastfeeding:       Wear a bra that supports your breasts and fits well. Use breast pads to absorb milk that leaks.       Keep your nipples clean and dry. Apply creams and ointments as told.       You may have uterine contractions every time you breastfeed for up to several weeks after delivery. This helps your uterus return to its normal size.       If you have any problems with breastfeeding, notify your health care provider or lactation consultant.      If you are not breastfeeding:       Avoid touching your breasts. Do not  squeeze out (express) milk. Doing this can make your breasts produce more milk.       Wear a good-fitting bra and use cold packs to help with swelling.     Intimacy and sexuality        Ask your health care provider when you can engage in sexual activity. This may depend upon:       Your risk of infection.       How fast you are healing.       Your comfort and desire to engage in sexual activity.       You are able to get pregnant after delivery, even if you have not had your period. Talk with your health care provider about methods of birth control (contraception) or family planning if you desire future pregnancies.     Medicines          Take over-the-counter and prescription medicines only as told by your health care provider.       Take an over-the-counter stool softener to help ease bowel movements as told by your health care provider.       If you were prescribed an antibiotic medicine, take it as told by your health care provider. Do not  stop taking the antibiotic even if you start to feel better.       Review all previous and current prescriptions to check for possible transfer into breast milk.     Activity         Gradually return to your normal activities as told by your health care provider.       Rest as much as possible. Nap while your baby is sleeping.     Eating and drinking            Drink enough fluid to keep your urine pale yellow.       To help prevent or relieve constipation, eat high-fiber foods every day.       Choose healthy eating to support breastfeeding or weight loss goals.       Take your prenatal vitamins until your health care provider tells you to stop.         General tips/recommendations        Do not  use any products that contain nicotine or tobacco. These products include cigarettes, chewing tobacco, and vaping devices, such as e-cigarettes. If you need help quitting, ask your health care provider.      Do not  drink alcohol, especially if you are breastfeeding.      Do not  take medications or drugs that are not prescribed to you, especially if you are breastfeeding.       Visit your health care provider for a postpartum  checkup within the first 3?6 weeks after delivery.       Complete a comprehensive postpartum visit no later than 12 weeks after delivery.       Keep all follow-up visits for you and your baby.     Contact a health care provider if:         You feel unusually sad or worried.       Your breasts become red, painful, or hard.       You have a fever or other signs of an infection.       You have bleeding that is soaking through one pad an hour or you have blood clots.       You have a severe  headache that doesn't go away or you have vision changes.       You have nausea and vomiting and are unable to eat or drink anything for 24 hours.     Get help right away if:         You have chest pain or difficulty breathing.       You have sudden, severe leg pain.       You faint or have a seizure.       You have thoughts about hurting yourself or your baby.     If you ever feel like you may hurt yourself or others, or have thoughts about taking your own life, get help right away. Go to your nearest emergency department or:      Call your local emergency services (911 in the [place]      The National Suicide Prevention Lifeline at 1-800-273-82501-143-303655 or 988 in the [place] This suicide crisis helpline is open 24 hours a day.      Text the Crisis Text Line at 661 653 6112741741 (in the [place]     Summary         The period of time after you deliver your newborn up to 6?12 weeks after delivery is called the postpartum period.       Keep all follow-up visits for you and your baby.       Review all previous and current prescriptions to check for possible transfer into breast milk.       Contact a health care provider if you feel unusually sad or worried during the postpartum period.     This information is not intended to replace advice given to you by your health care provider. Make sure you discuss any questions you have with your health care provider.     Document Released: 10/14/2008Document Revised: 07/13/2022Document Reviewed: 10/21/2019     Elsevier Patient Education ? 2022 Elsevier Inc[employer]

## 2021-02-26 NOTE — Lactation Note (Signed)
This note was copied from a baby's chart.  Lactation discharge visit. Infant latched well upon my arrival. Infant has been exclusively breastfeeding without difficulty. Mother's milk is in and engorgement management reviewed. Mother did use the pump last evening for some relief.  Education completed. Lactation outpatient services reviewed and mother advised to call as needed.  D/c wt 7lb13oz; 3%wt loss which is an increase from the previous night.

## 2021-02-27 ENCOUNTER — Encounter: Payer: PRIVATE HEALTH INSURANCE | Primary: Internal Medicine

## 2021-02-27 ENCOUNTER — Encounter

## 2021-02-28 ENCOUNTER — Other Ambulatory Visit (INDEPENDENT_AMBULATORY_CARE_PROVIDER_SITE_OTHER)

## 2021-02-28 ENCOUNTER — Telehealth (INDEPENDENT_AMBULATORY_CARE_PROVIDER_SITE_OTHER)

## 2021-02-28 NOTE — Telephone Encounter (Signed)
Pt is 6 days PP, had C/S on 02/22/21. Reports she has started having a sharp pain in her urethra/vaginal area when she urinates. States she feels she is straining to urinate because of the pain. She denies burning or frequency. Please advise.

## 2021-03-01 ENCOUNTER — Encounter (INDEPENDENT_AMBULATORY_CARE_PROVIDER_SITE_OTHER)

## 2021-03-01 ENCOUNTER — Other Ambulatory Visit: Admit: 2021-03-01 | Payer: PRIVATE HEALTH INSURANCE | Primary: Internal Medicine

## 2021-03-01 ENCOUNTER — Telehealth (INDEPENDENT_AMBULATORY_CARE_PROVIDER_SITE_OTHER): Admitting: Women's Health

## 2021-03-01 ENCOUNTER — Encounter (HOSPITAL_BASED_OUTPATIENT_CLINIC_OR_DEPARTMENT_OTHER): Admitting: Advanced Practice Midwife

## 2021-03-01 ENCOUNTER — Ambulatory Visit
Admit: 2021-03-01 | Discharge: 2021-03-01 | Payer: PRIVATE HEALTH INSURANCE | Attending: Women's Health | Primary: Internal Medicine

## 2021-03-01 ENCOUNTER — Encounter

## 2021-03-01 ENCOUNTER — Other Ambulatory Visit

## 2021-03-01 ENCOUNTER — Encounter: Payer: PRIVATE HEALTH INSURANCE | Primary: Internal Medicine

## 2021-03-01 VITALS — BP 126/72 | Temp 97.8°F | Wt 188.0 lb

## 2021-03-01 DIAGNOSIS — R300 Dysuria: Secondary | ICD-10-CM

## 2021-03-01 DIAGNOSIS — L7682 Other postprocedural complications of skin and subcutaneous tissue: Secondary | ICD-10-CM

## 2021-03-01 DIAGNOSIS — Z3480 Encounter for supervision of other normal pregnancy, unspecified trimester: Secondary | ICD-10-CM

## 2021-03-01 LAB — URINALYSIS
Bacteria, Ur: NONE SEEN /HPF
Bilirubin, Ur: NEGATIVE
Casts, Ur: 0 /LPF (ref 0–4)
Epithelial Cells, UR: 0 {cells}/[HPF] (ref 0–5)
Glucose,Ur: NEGATIVE mg/dL
Ketones, Ur: NEGATIVE mg/dL
Nitrite, Ur: NEGATIVE
Protein,Ur: NEGATIVE mg/dL
RBC, Ur: 1 /HPF (ref 0–4)
Specific Gravity, Ur: 1.015 (ref 1.005–1.030)
Urobilinogen, Ur: 0.2 (ref 0.2–1.0)
WBC, Ur: 3 /HPF (ref 0–5)
pH, Ur: 7 (ref 5.0–8.0)

## 2021-03-01 LAB — IRON: Iron: 52 ug/dL (ref 30–180)

## 2021-03-01 NOTE — Progress Notes (Signed)
[facility]  [facility] [place]  [address]  [address]  [place] [address]  Dept: [phone]  Dept Fax: [phone]     Patient ID: Sophia Brewer is a 31 y.o. [id number] who presents for postpartum problem visit    Subjective:   Pt here with FOB and baby boy.   Pt had a Primary c/s on 02/22/21 for non reassuring FHT s/p IOL for cholestasis. Hx chronic anemia  Today she reports intermittent low abd/pelvic pain with urination x 2 days. Denies frequency or urgency. Denies any fever. No foul odor. Denies any flank pain. Pt did drop off urine for UA C&S earlier today. Results pending.     Pt also reports incisional pain, worse on the left than on the right. Denies any open areas, redness, edema, or drainage.    Lochia is light. No clots.   Pt had H/A today during appt. Took Tylenol. H/A subsided by the end of the appt. Denies any Pre-e sx.     Problems:   Patient Active Problem List   Diagnosis   . History of abnormal cervical Pap smear   . Supervision of other normal pregnancy, antepartum   . Vitamin D deficiency   . Anemia affecting pregnancy in third trimester     Current Medications:   Current Outpatient Medications   Medication Instructions   . acetaminophen (TYLENOL) 975 mg, oral, Every 8 hours scheduled   . ferrous sulfate 325 mg, oral, Daily with breakfast   . ibuprofen 800 mg, oral, Every 8 hours PRN   . oxyCODONE (ROXICODONE) 5 mg, oral, Every 4 hours PRN   . prenatal multivitamin (Prenatal) tablet 1 tablet, oral, Daily     Allergies: No Known Allergies  Past Medical History:   Past Medical History:   Diagnosis Date   . Migraines      Past Surgical History: No past surgical history on file.  Family History:   Family History   Family history unknown: Yes     Social History:   Social History     Tobacco Use   . Smoking status: Never   . Smokeless tobacco: Never   Substance Use Topics   . Alcohol use: Not Currently   . Drug use:  Never       Physical Exam:  BP 126/72   Temp 36.6 C (97.8 F)   Wt 85.3 kg   LMP 05/26/2020 (Exact Date)   BMI 33.30 kg/m      Appearance: Normal appearance.   HENT: Normocephalic and atraumatic.   Lungs: Normal respiratory effort  Abdomen: Soft, nondistended. Healing Pfan incision. Edges well approximated. No edema, redness, or drainage. 5 cm x 2 cm induration palpable at the left margin of the incision c/w fibrosis. Tender with palpation. Disc with NOR. Incision assessed by NOR as well.   Genitourinary:     General: Normal vulva. Dark red lochia present.      Urethra: Normal. No lesions, redness, or bleeding.   Musculoskeletal:    Normal range of motion. 2+ bilat pedal edema  Skin: Warm and dry.   Neurological: No gross neurological deficits  Psychiatric:    Normal affect, behavior and judgment       Urine dip in office. Pos blood . Otherwise Neg.     Assessment/Plan:  1. Dysuria. Urine dip result + blood. Likely due to lochia. She will continue to monitor her sx. Will wait for Urine cx results  before tx pt. Pt will call if sx worsen or no improvement.   Disc comfort measures.   2. Incisional pain-  left apical fascial stitch pain with palpable induration c/w fibrosis. Will order U/S to rule out fluid collection, hematoma, or hernia. Likely musculoskeletal pain. Pt will call if symptoms worsen. F/u with physician.   Two week and 6 week PP visit scheduled.    Aware of sx to report. F/U as needed.     Marye Round, NP

## 2021-03-01 NOTE — Telephone Encounter (Signed)
Hello. This pt has an abd U/S booked next week to rule out an abcess at her incision. I disc this pt with NOR. She should also be seen by Dr Berna Bueochran who did the c/s. Can you schedule this [facility] and let the pt know? Thank you. SO

## 2021-03-02 ENCOUNTER — Encounter: Payer: PRIVATE HEALTH INSURANCE | Attending: Obstetrics & Gynecology | Primary: Internal Medicine

## 2021-03-02 LAB — URINE CULTURE: Urine Culture: NO GROWTH

## 2021-03-05 ENCOUNTER — Encounter (INDEPENDENT_AMBULATORY_CARE_PROVIDER_SITE_OTHER): Admitting: Obstetrics & Gynecology

## 2021-03-06 ENCOUNTER — Ambulatory Visit: Payer: PRIVATE HEALTH INSURANCE | Primary: Internal Medicine

## 2021-03-06 ENCOUNTER — Encounter (INDEPENDENT_AMBULATORY_CARE_PROVIDER_SITE_OTHER)

## 2021-03-06 ENCOUNTER — Other Ambulatory Visit (INDEPENDENT_AMBULATORY_CARE_PROVIDER_SITE_OTHER)

## 2021-03-07 ENCOUNTER — Encounter

## 2021-03-07 ENCOUNTER — Encounter: Payer: PRIVATE HEALTH INSURANCE | Primary: Internal Medicine

## 2021-03-08 ENCOUNTER — Encounter (HOSPITAL_BASED_OUTPATIENT_CLINIC_OR_DEPARTMENT_OTHER)

## 2021-03-09 ENCOUNTER — Encounter: Payer: PRIVATE HEALTH INSURANCE | Attending: Specialist | Primary: Internal Medicine

## 2021-03-13 ENCOUNTER — Telehealth
Admit: 2021-03-13 | Discharge: 2021-03-13 | Payer: PRIVATE HEALTH INSURANCE | Attending: Advanced Practice Midwife | Primary: Internal Medicine

## 2021-03-13 NOTE — Progress Notes (Addendum)
This real-time, interactive virtual Telehealth encounter was done by video with the patient's verbal consent. Two patient identifiers were used and confirmed. Physical location of the patient: home Patient resides in: [place]  Physical location of the provider: home. Other participants/involvement: none  Total minutes spent: 15 min    TH visit performed for 2-3w PP check in. Pt aware there is no PE in this appointment and that she can come into office if needed or preferred for PE/in person visit. I identified myself and my credentials with pt and she confirmed her identity with two identifiers: DOB and name. Pt aware there may be a charge with this visit and agrees to payment of this charge, Pt is alone in room throughout visit.      31 y.o.  G6P3 s/p P C/S on 02/22/2021 to viable baby boy.   Doing well at home. Denies sx PRE E/fever/heavy bleeding/VTE or mood disorder .No depression reported. No SI/HI. Infant is BF and growing well. Lochia now scant spotting.  No resumption of  IC .  States C/S site healing. Had dysuria and  increased incisional pain and swelling to left side of incision on 03/01/21 and seen in office . Dysuria has resolved and UC neg. Pt continues with ild pain and swelling to this area. No "better/no worse". No dng or fever. Prior provider ordered US to r/o abscess. Pt has scheduled for tomorrow with f/u discussion in office. Voiding freely. BM QD with occ constipation. REv PP self care and warning signs. Detailed conversation re; typical new mom anxiety, exercise, self care. Husband is home and helping. Pt unsure of contraception wishes. RTC tomorrow for US for f/u incisional pain.

## 2021-03-14 ENCOUNTER — Other Ambulatory Visit

## 2021-03-14 ENCOUNTER — Encounter (HOSPITAL_BASED_OUTPATIENT_CLINIC_OR_DEPARTMENT_OTHER)

## 2021-03-14 ENCOUNTER — Encounter

## 2021-03-14 ENCOUNTER — Other Ambulatory Visit (INDEPENDENT_AMBULATORY_CARE_PROVIDER_SITE_OTHER): Admitting: Women's Health

## 2021-03-14 ENCOUNTER — Telehealth (INDEPENDENT_AMBULATORY_CARE_PROVIDER_SITE_OTHER): Admitting: Advanced Practice Midwife

## 2021-03-14 ENCOUNTER — Ambulatory Visit: Payer: PRIVATE HEALTH INSURANCE | Primary: Internal Medicine

## 2021-03-14 DIAGNOSIS — G8918 Other acute postprocedural pain: Secondary | ICD-10-CM

## 2021-03-14 NOTE — Telephone Encounter (Signed)
Patient is calling for her US results.  Please call patient at [phone].  Thank you

## 2021-03-16 ENCOUNTER — Telehealth (INDEPENDENT_AMBULATORY_CARE_PROVIDER_SITE_OTHER): Admitting: Women's Health

## 2021-03-16 NOTE — Telephone Encounter (Signed)
Spoke to pt regarding her U/S results. Pt informed of likely seroma. Disc with BP. Plan conservative management if no sx of infection. Pt denies fever, drainage, pain, edema. States incision is well healed, not open. Will call if any sx of infection. SO

## 2021-04-13 ENCOUNTER — Other Ambulatory Visit
Admit: 2021-04-13 | Discharge: 2021-04-13 | Payer: PRIVATE HEALTH INSURANCE | Attending: Obstetrics & Gynecology | Primary: Internal Medicine

## 2021-04-13 ENCOUNTER — Other Ambulatory Visit

## 2021-04-13 ENCOUNTER — Ambulatory Visit: Attending: Obstetrics & Gynecology | Admitting: Obstetrics & Gynecology

## 2021-04-13 ENCOUNTER — Encounter (INDEPENDENT_AMBULATORY_CARE_PROVIDER_SITE_OTHER): Admitting: Obstetrics & Gynecology

## 2021-04-13 NOTE — Progress Notes (Signed)
[facility]  [facility] [place]  [address]  [address]  [place] [id number]  Dept: [phone]  Dept Fax: [phone]     Patient ID: Sophia ReichmannKay Brewer is a 31 y.o. female who presents for Postpartum Follow-up.    Subjective   PP exam  G6P3  C/S x 1 for NRT  Breastfeeding  Going well NO br problems  Baby gaining well  No vag bldg  No problem with ur ,bowels  PPD score = 0  Incision clean    Home for 3 months          Objective   Visit Vitals  BP 100/62   Wt 74.4 kg   LMP 05/26/2020 (Exact Date)   Breastfeeding Yes   BMI 29.05 kg/m   OB Status Recent pregnancy   BSA 1.82 m       Physical Exam  Constitutional:       Appearance: Normal appearance.   Chest:   Breasts:     Right: Normal.      Left: Normal.      Comments: lactating  Abdominal:      Palpations: Abdomen is soft.      Comments: Incision clean,intact   Genitourinary:     General: Normal vulva.      Vagina: Normal. No bleeding.      Cervix: Normal.      Uterus: Normal.       Adnexa: Right adnexa normal and left adnexa normal.      Comments: Pap done  Neurological:      Mental Status: She is alert.         Assessment/Plan   Sophia Brewer was seen today for postpartum follow-up.  Routine postpartum follow-up  -     Pap Smear    PP exam  Breastfeeding  Will use condoms  Incision clean  Hgb up to 12  Pap done  Has help at home  Annual followup. MM

## 2021-04-26 LAB — PAP SMEAR: Interpretation: NEGATIVE

## 2021-08-16 ENCOUNTER — Encounter: Attending: Student in an Organized Health Care Education/Training Program | Primary: Internal Medicine

## 2021-08-16 ENCOUNTER — Ambulatory Visit: Admit: 2021-08-16 | Discharge: 2021-08-16 | Payer: MEDICAID | Attending: Nurse Practitioner | Primary: Internal Medicine

## 2021-08-16 DIAGNOSIS — Z0000 Encounter for general adult medical examination without abnormal findings: Secondary | ICD-10-CM

## 2021-08-16 NOTE — Telephone Encounter (Signed)
Scheduled thyroid US for 08/29/21 @ 1pm for the main campus. Spoke with pt to give appt info.

## 2021-08-16 NOTE — Progress Notes (Signed)
[facility] - Sophia Brewer [address]  [address]  [address]  Sophia Brewer [place] [address]  Dept: [phone]  Dept Fax: [phone]     Patient ID: Sophia Brewer is a 31 y.o. female who presents for Annual Exam.    Subjective   Patient here for physical exam  Doing well  PHQ-9 negative  Followed by GYN     Has a 71 month old baby   Pap smear 03/2021 negative- followed by [facility]   Breast feeding     Right scapula pain for months  Would like to see ortho    Also concerned about thyroid  Was told it was enlarged in the past Would like to follow up      Review of Systems   Constitutional: Negative.    HENT: Negative.    Eyes: Negative.    Respiratory: Negative.    Cardiovascular: Negative.    Gastrointestinal: Negative.    Genitourinary: Negative.    Musculoskeletal: Negative.    Skin: Negative.    Neurological: Negative.    Endo/Heme/Allergies: Negative.    Psychiatric/Behavioral: Negative.    All other systems reviewed and are negative.       Patient Active Problem List   Diagnosis   . History of abnormal cervical Pap smear   . Supervision of other normal pregnancy, antepartum   . Vitamin D deficiency   . Anemia affecting pregnancy in third trimester     No current outpatient medications  No Known Allergies  Past Medical History:   Diagnosis Date   . Migraines      No past surgical history on file.  Family History   Problem Relation Name Age of Onset   . Hypertension Father     . Breast cancer Maternal Grandfather       Social History     Tobacco Use   . Smoking status: Never   . Smokeless tobacco: Never   Substance Use Topics   . Alcohol use: Yes     Comment: 1-2x per year   . Drug use: Yes     Types: Marijuana     Comment: 1-2x a monthy       Objective   Visit Vitals  BP 109/74 (BP Location: Left arm)   Pulse 86   Temp 36 C (96.8 F) (Temporal)   Ht 1.6 m   Wt 74.8 kg   SpO2 96%   BMI 29.23 kg/m   OB Status Recent pregnancy   BSA 1.82 m       Physical Exam  Constitutional:       Appearance:  Normal appearance. She is normal weight.   HENT:      Right Ear: Tympanic membrane normal.      Nose: Nose normal.      Mouth/Throat:      Mouth: Mucous membranes are dry.      Pharynx: Oropharynx is clear.   Eyes:      Extraocular Movements: Extraocular movements intact.      Conjunctiva/sclera: Conjunctivae normal.      Pupils: Pupils are equal, round, and reactive to light.   Cardiovascular:      Rate and Rhythm: Normal rate and regular rhythm.      Pulses: Normal pulses.      Heart sounds: Normal heart sounds.   Pulmonary:      Effort: Pulmonary effort is normal.      Breath sounds: Normal breath sounds.   Abdominal:  General: Bowel sounds are normal.   Musculoskeletal:         General: Normal range of motion.      Cervical back: Normal range of motion and neck supple.   Skin:     General: Skin is warm and dry.   Neurological:      General: No focal deficit present.      Mental Status: She is alert and oriented to person, place, and time.      Cranial Nerves: No cranial nerve deficit.      Motor: No weakness.      Deep Tendon Reflexes: Reflexes normal.   Psychiatric:         Mood and Affect: Mood normal.         Behavior: Behavior normal.         Thought Content: Thought content normal.         Assessment/Plan   [name] was seen today for annual exam.  Physical exam  -     CBC and differential; Future  -     Comprehensive metabolic panel; Future  -     Lipid panel; Future  Enlarged thyroid  -     TSH with reflex; Future  -     US THYROID; Future  Pain of right scapula  -     LCO  [facility]; Future  Normal PE  Discussed healthy diet, exercise especially weightbearing--146min/week. Discussed calcium through the diet (900mg /day) and vitamin D intake.   Continue multivitamin.  Advised monthly self breast exam.    Followed by GYN Pap smear   Annual influenza vaccine recommended.  CBC, CMP, Lipid profile ordered

## 2021-08-29 ENCOUNTER — Other Ambulatory Visit: Admit: 2021-08-29 | Payer: MEDICAID | Primary: Internal Medicine

## 2021-08-29 ENCOUNTER — Inpatient Hospital Stay: Admit: 2021-08-29 | Payer: MEDICAID | Primary: Internal Medicine

## 2021-08-29 DIAGNOSIS — Z0000 Encounter for general adult medical examination without abnormal findings: Secondary | ICD-10-CM

## 2021-08-29 DIAGNOSIS — E049 Nontoxic goiter, unspecified: Secondary | ICD-10-CM

## 2021-08-29 LAB — CBC WITH DIFFERENTIAL
Basophils %: 0.5 %
Basophils Absolute: 0.02 10*3/uL (ref 0.00–0.22)
Eosinophils %: 2.1 %
Eosinophils Absolute: 0.09 10*3/uL (ref 0.00–0.50)
Hematocrit: 36.6 % (ref 32.0–47.0)
Hemoglobin: 11.5 g/dL (ref 11.0–16.0)
Immature Granulocytes %: 0.2 %
Immature Granulocytes Absolute: 0.01 10*3/uL (ref 0.00–0.10)
Lymphocyte %: 48.8 %
Lymphocytes Absolute: 2.1 10*3/uL (ref 0.70–4.00)
MCH: 26.3 pg (ref 26.0–34.0)
MCHC: 31.4 g/dL (ref 31.0–37.0)
MCV: 83.6 fL (ref 80.0–100.0)
MPV: 10.4 fL (ref 9.1–12.4)
Monocytes %: 7.9 %
Monocytes Absolute: 0.34 10*3/uL — ABNORMAL LOW (ref 0.36–0.77)
NRBC %: 0 % (ref 0.0–0.0)
NRBC Absolute: 0 10*3/uL (ref 0.00–2.00)
Neutrophil %: 40.5 %
Neutrophils Absolute: 1.74 10*3/uL (ref 1.50–7.95)
Platelets: 301 10*3/uL (ref 150–400)
RBC: 4.38 M/uL (ref 3.70–5.20)
RDW-CV: 13.7 % (ref 11.5–14.5)
RDW-SD: 42.1 fL (ref 35.0–51.0)
WBC: 4.3 10*3/uL (ref 4.0–11.0)

## 2021-08-29 LAB — COMPREHENSIVE METABOLIC PANEL
ALT: 14 U/L (ref 0–55)
AST: 14 U/L (ref 6–42)
Albumin: 3.6 g/dL (ref 3.2–5.0)
Alkaline phosphatase: 58 U/L (ref 30–130)
Anion Gap: 6 mmol/L (ref 3–14)
BUN: 10 mg/dL (ref 6–24)
Bilirubin, total: 0.5 mg/dL (ref 0.2–1.2)
CO2 (Bicarbonate): 27 mmol/L (ref 20–32)
Calcium: 9.3 mg/dL (ref 8.5–10.5)
Chloride: 110 mmol/L (ref 98–110)
Creatinine: 0.71 mg/dL (ref 0.55–1.30)
Glucose: 72 mg/dL (ref 70–110)
Potassium: 4 mmol/L (ref 3.6–5.2)
Protein, total: 7.4 g/dL (ref 6.0–8.4)
Sodium: 143 mmol/L (ref 135–146)
eGFRcr: 118 mL/min/{1.73_m2} (ref >=60)

## 2021-08-29 LAB — TSH WITH REFLEX: TSH: 0.66 u[IU]/mL (ref 0.358–3.740)

## 2021-08-29 LAB — LIPID PANEL
Cholesterol: 172 mg/dL (ref <=200)
HDL cholesterol: 67 mg/dL (ref >=40)
LDL cholesterol, calculated: 97 mg/dL (ref 0–130)
Triglycerides: 40 mg/dL (ref <=150)

## 2021-08-29 NOTE — Result Quicknote (Signed)
US thyroid,  No solid nodules which meet TI-RADS criteria for follow-up. Scattered small colloid cysts as above ( normally benign growths)   Please have patient follow up with endocrine, Referral in computer thank you, Florentina Addison

## 2021-08-29 NOTE — Result Quicknote (Signed)
Labs all stable  Thank you, Florentina AddisonKatie

## 2021-11-09 ENCOUNTER — Encounter: Payer: MEDICAID | Attending: Family | Primary: Internal Medicine

## 2021-11-12 ENCOUNTER — Ambulatory Visit
Admit: 2021-11-12 | Discharge: 2021-11-15 | Payer: MEDICAID | Attending: Physician Assistant | Primary: Internal Medicine

## 2021-11-12 DIAGNOSIS — N898 Other specified noninflammatory disorders of vagina: Secondary | ICD-10-CM

## 2021-11-12 NOTE — Progress Notes (Signed)
[facility] GROUP -  [address]  [address]  [address]  [place] [address]  Dept: [phone]  Dept Fax: [phone]     Patient ID: Sophia Brewer is a 31 y.o. female who presents for Vaginal Itching (Burning, pt used an itch cream on friday and it worked and no itching at the moment ).    Subjective   Has been having vaginal itchiness   3 da go started to use vaginal cream for itch otc feels little better but wants to be checked  Denies excessive vag discharge or odor, denies abd pain          Review of Systems   Constitutional: Negative.    HENT: Negative.    Eyes: Negative.    Respiratory: Negative.    Cardiovascular: Negative.    Gastrointestinal: Negative.    Genitourinary: Negative.         Vaginal itching    Musculoskeletal: Negative.    Skin: Negative.    Neurological: Negative.    Endo/Heme/Allergies: Negative.    Psychiatric/Behavioral: Negative.             Objective   Visit Vitals  BP 109/72 (BP Location: Left arm)   Pulse 78   Wt 74.9 kg   SpO2 99%   BMI 29.26 kg/m   OB Status Recent pregnancy   BSA 1.82 m       Physical Exam  Constitutional:       General: She is not in acute distress.     Appearance: Normal appearance.   Cardiovascular:      Rate and Rhythm: Normal rate and regular rhythm.      Heart sounds: No murmur heard.  Pulmonary:      Breath sounds: Normal breath sounds.   Abdominal:      General: There is no distension.      Palpations: Abdomen is soft.      Tenderness: There is no abdominal tenderness.   Genitourinary:     General: Normal vulva.      Vagina: No vaginal discharge.      Comments: Minimal white vaginal discharge   Musculoskeletal:      Right lower leg: No edema.      Left lower leg: No edema.   Skin:     Findings: No erythema or rash.   Neurological:      General: No focal deficit present.      Mental Status: She is alert and oriented to person, place, and time.      Gait: Gait is intact.         Assessment/Plan   Sophia Brewer was seen today for vaginal  itching.  Vaginal itching  Comments:  get ganial cx, GC/CT , fu on results   Orders:  -     Genital Culture; Future  -     GC/CT (Amp DNA); Future

## 2021-11-13 LAB — GC/CT (AMP DNA)
CT DNA: NOT DETECTED
GC DNA: NOT DETECTED

## 2021-11-13 MED ORDER — fluconazole (Diflucan) 150 mg tablet
150 | ORAL_TABLET | Freq: Once | ORAL | 0 refills | 10.00000 days | Status: AC
Start: 2021-11-13 — End: 2021-11-13

## 2021-11-15 LAB — GENITAL CULTURE: Culture: NORMAL

## 2021-12-06 ENCOUNTER — Ambulatory Visit
Admit: 2021-12-06 | Discharge: 2021-12-06 | Payer: MEDICAID | Attending: Student in an Organized Health Care Education/Training Program | Primary: Internal Medicine

## 2021-12-06 DIAGNOSIS — E041 Nontoxic single thyroid nodule: Secondary | ICD-10-CM

## 2021-12-06 NOTE — Patient Instructions (Signed)
DO blood test and thyroid ultrasound prior to appointment

## 2021-12-06 NOTE — Progress Notes (Signed)
Endocrinology outpatient visit note      Date of Service:  12/06/2021     History of Present Illness:  Sophia Brewer is a 31 y.o. female with thyroid nodule is referred for initial consultation.    Patient states she was told about thyroid nodule on an ultrasound in 2021.    Most recent thyroid ultrasound on 08/29/2021 shows solitary right nodule measuring 0.8 x 0.5 x 0.8 cm, mixed cystic and solid, hypoechoic  She has never had thyroid FNA.  She denies any family history of thyroid disease.  She delivered a baby in 1/23.  LMP - 11/18/21, reports heavy bleedinazg with clots. She is breastfeeding.  She feels well today with no concerning symptoms.  7/23 TSh 0.6    Past Medical History   has a past medical history of Migraines.    Medications  No current outpatient medications on file prior to visit.     No current facility-administered medications on file prior to visit.        Allergies  Patient has no known allergies.    Surgical History   has a past surgical history that includes Cesarean section, low transverse (2023).     Family History  Family History   Problem Relation Name Age of Onset   . Hypertension Father     . Breast cancer Maternal Grandfather         Social History   reports that she has never smoked. She has never used smokeless tobacco. She reports that she does not currently use alcohol. She reports that she does not currently use drugs after having used the following drugs: Marijuana.     Review of Systems  Review of Systems   Constitutional: Negative for fatigue and unexpected weight change.   HENT: Negative for trouble swallowing and voice change.    Eyes: Negative for visual disturbance.   Respiratory: Negative for cough and shortness of breath.    Cardiovascular: Negative for chest pain.   Gastrointestinal: Negative for abdominal pain and constipation.   Endocrine: Negative for polyphagia and polyuria.   Genitourinary: Positive for  menstrual problem. Negative for dysuria and frequency.   Neurological: Negative for dizziness and syncope.   Psychiatric/Behavioral: Negative for agitation and confusion.        Objective   Physical Exam:  Vitals:  Temp: 36.6 C (97.8 F)  Pulse: 96  BP: 116/78    Temp:  [36.6 C (97.8 F)] 36.6 C (97.8 F)  Pulse:  [96] 96  BP: (116)/(78) 116/78   Physical Exam  Vitals reviewed.   Constitutional:       Appearance: Normal appearance.   HENT:      Head: Normocephalic and atraumatic.   Eyes:      General: No scleral icterus.     Extraocular Movements: Extraocular movements intact.   Neck:      Thyroid: No thyromegaly or thyroid tenderness.   Cardiovascular:      Rate and Rhythm: Normal rate and regular rhythm.      Heart sounds: Normal heart sounds.   Pulmonary:      Effort: Pulmonary effort is normal. No respiratory distress.      Breath sounds: Normal breath sounds.   Musculoskeletal:      Right lower leg: No edema.      Left lower leg: No edema.   Skin:     General: Skin is warm and dry.   Neurological:      Mental Status: She is alert  and oriented to person, place, and time.      Motor: No weakness.      Gait: Gait normal.   Psychiatric:         Mood and Affect: Mood normal.         Behavior: Behavior normal.          Lab Results:  Lab Results   Component Value Date    WBC 4.3 08/29/2021    HGB 11.5 08/29/2021    MCV 83.6 08/29/2021    PLT 301 08/29/2021      Lab Results   Component Value Date    GLUCOSE 72 08/29/2021    CALCIUM 9.3 08/29/2021    NA 143 08/29/2021    K 4.0 08/29/2021    CO2 27 08/29/2021    CL 110 08/29/2021    BUN 10 08/29/2021    CREATININE 0.71 08/29/2021      Lab Results   Component Value Date    TSH 0.660 08/29/2021      Radiology:  PROCEDURE: US THYROID  08/29/2021 1:27 PM  INDICATIONS: Palpable nodule or thyroid enlargement Nontoxic goiter, unspecified  COMPARISON: None.    TECHNIQUE: Two dimensional gray scale and color Doppler imaging of the thyroid gland and neck was  performed.    FINDINGS:    The right lobe of the thyroid measures 2.1 x 1.6 x 4.9 cm.  The left lobe measures 1.4 x 1.1 x 4.3 cm.  The isthmus measures 0.3 cm.    The thyroid parenchyma is homogeneous.  Vascularity within the thyroid parenchyma is normal.    There are unilocular cysts within the right mid thyroid and left mid thyroid each measuring up to 0.2 cm in diameter.    Thyroid nodules: There is a solitary nodule in the right lobe, as follows:    Right nodule 1: 0.8 x 0.5 x 0.8 cm.  R1 Location: Right interpolar region  R1 Composition: Mixed cystic and solid (1)  R1 Echogenicity: Hypoechoic (2)  R1 Shape: Not taller than wide (0)  R1 Margins: Smooth (0)  R1 Echogenic foci: Large comet-tail artifacts (0)  R1 Additional echogenic foci: None (0)  R1 Significant change in size (>/= 20% in two dimensions and increase in 2mm): N/A  R1 Change in features: N/A  R1 ACR TI-RADS total points: 3  R1 ACR TI-RADS risk category: TR3 (3 points)  R1 ACR TI-RADS recommendation: No follow-up indicated    IMPRESSION:  No solid nodules which meet TI-RADS criteria for follow-up. Scattered small colloid cysts as above.    Assessment/Plan     Thyroid nodule  I have personally reviewed the ultrasound images.  The thyroid nodule appears very low suspicion for malignancy as per ATA criteria.  Will recommend repeat thyroid ultrasound in July 2024.  I discussed with patient that if the ultrasound next year shows a significant increase in the size of the nodule she may need a biopsy.  She is clinically and biochemically euthyroid.    Problem List Items Addressed This Visit           ICD-10-CM    Thyroid nodule - Primary E04.1    Relevant Orders    US THYROID    TSH with reflex   Other Issues Addressed       Codes    Abnormal ultrasound of thyroid gland     R93.89           Patient told to schedule a follow-up visit in 10 months.  Molli Barrows MD MMSc

## 2021-12-20 ENCOUNTER — Ambulatory Visit
Admit: 2021-12-20 | Discharge: 2021-12-20 | Payer: Auto Insurance (includes no fault) | Attending: Family | Primary: Internal Medicine

## 2021-12-20 DIAGNOSIS — S134XXA Sprain of ligaments of cervical spine, initial encounter: Secondary | ICD-10-CM

## 2021-12-20 MED ORDER — cyclobenzaprine (Flexeril) 10 mg tablet
10 | ORAL_TABLET | Freq: Every evening | ORAL | 2 refills | Status: AC | PRN
Start: 2021-12-20 — End: 2022-08-17
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
## 2022-01-22 ENCOUNTER — Ambulatory Visit
Admit: 2022-01-22 | Discharge: 2022-01-22 | Payer: MEDICAID | Attending: Advanced Practice Midwife | Primary: Internal Medicine

## 2022-01-22 DIAGNOSIS — N920 Excessive and frequent menstruation with regular cycle: Secondary | ICD-10-CM

## 2022-01-22 LAB — POCT PREGNANCY, URINE: POC hCG Qual, Ur: NEGATIVE

## 2022-01-22 NOTE — Progress Notes (Signed)
[facility] [place]  [address]  [address]  [place] [address]  Dept: [phone]  Dept Fax: [phone]     Patient ID: Sophia Brewer is a 31 y.o.  G [phone] for concerns of heavy menses       Subjective   31 yo G6 P3033 - reports last few menses "heavier than ususal."  Pt had P C/S Jan 2023- she is nursing.  Pt reports has had 3- 4 menses  In last 6 months- reports menses "a little more clots than before last delivery."  We reviewed menses may be irregular while nursing.    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 Offered BC- declined      Objective    Visit Vitals  BP 108/72   Wt 75.3 kg   LMP 01/11/2022 (Exact Date)   Breastfeeding Yes   BMI 29.41 kg/m   OB Status Recent pregnancy   BSA 1.83 m       Physical Exam   Vagina: mucosa pink, no leions  Cervix: [arous, no leions- no  Bleeding today  Uterus/ ovaries_- NT, no massess.    Assessment/ plan  31 yo multip - irregular menses  Reviewed presently breast feeding- menses may be irregular.  Offered progesterone BC - declined  Advised if bleeding > 1pad an hour or prolonged menses > 10 consecutive days for 1 - 2 months- may order TV US.  All questions answred.  F/U for annual exam feb 2024 or prn.

## 2022-01-22 NOTE — Addendum Note (Signed)
Addended by: Ferdie Ping on: 01/22/2022 03:12 PM     Modules accepted: Orders

## 2022-06-13 ENCOUNTER — Ambulatory Visit: Admit: 2022-06-13 | Discharge: 2022-06-13 | Payer: MEDICAID | Attending: Family | Primary: Internal Medicine

## 2022-06-13 DIAGNOSIS — N912 Amenorrhea, unspecified: Secondary | ICD-10-CM

## 2022-06-13 LAB — SARS/FLU/RSV

## 2022-06-13 LAB — POCT PREGNANCY, URINE: POC hCG Qual, Ur: NEGATIVE

## 2022-06-13 MED ORDER — benzonatate (Tessalon) 100 mg capsule
100 | ORAL_CAPSULE | Freq: Three times a day (TID) | ORAL | 0 refills | 10.00000 days | Status: AC | PRN
Start: 2022-06-13 — End: 2022-07-13

## 2022-06-13 NOTE — Progress Notes (Signed)
Date: 06/13/2022      Patient:Sophia Brewer  DOB:02/18/1991    [facility] GROUP  Vernelle Emerald, NP  OFFICE PHONE: [phone]  OFFICE FAX: [phone]    If you are the patient reviewing the medical notes, please keep in mind that medical notes are often written with abbreviations and medical terminology.  Provider documentation is typically for other providers to review in order to document patient's history/progress/test ordered/performed/interpreted in the most efficient manner.  This notes are made available for patients to review but are not written specifically with patient consumption in mind.    CHIEF COMPLAINT:  Chief Complaint   Patient presents with   . Cough     Since saturday    . Sore Throat   . Ear Fullness     Ear is blocked        HISTORY OF PRESENT ILLNESS:    Sophia Brewer is a 32 y.o. female who is here for a sick visit.    Complaint of cough, Loss of appeitite, nasal congesiton x 5 days  Not taking anything OTC   No fevers   Body aches initially but better now    Also requesting pregnancy test   LMP : 3/25 still breastfeeding        Health Maintenance   Topic Date Due   . Varicella Vaccines (1 of 2 - 13+ 2-dose series) Never done   . COVID-19 Vaccine (3 - 2023-24 season) 10/19/2021   . Depression Screening  02/18/2022   . Influenza Vaccine (Season Ended) 10/20/2022   . Cervical Cancer Screening  04/13/2024   . DTaP/Tdap/Td Vaccines (5 - Td or Tdap) 10/20/2029   . Hepatitis B Vaccines  Completed   . IPV Vaccines  Completed   . MMR Vaccines  Completed   . HPV Vaccines  Completed   . Hepatitis C Screening  Completed   . HIB Vaccines  Aged Out   . Hepatitis A Vaccines  Aged Out   . Meningococcal Vaccine  Aged Out   . Rotavirus Vaccines  Aged Out   . Pneumococcal Vaccine: Pediatrics (0 to 5 Years) and At-Risk Patients (6 to 42 Years)  Aged Out       Past Medical History:   Diagnosis Date   . Migraines        Past Surgical History:   Procedure Laterality Date   . CESAREAN SECTION, LOW  TRANSVERSE  2023       Current Outpatient Medications   Medication Sig Dispense Refill   . benzonatate (Tessalon) 100 mg capsule Take 1 capsule (100 mg) by mouth if needed in the morning, at noon, and at bedtime for cough. Do not crush or chew. 42 capsule 0     No current facility-administered medications for this visit.       No Known Allergies    Social History     Tobacco Use   . Smoking status: Never   . Smokeless tobacco: Never   Substance Use Topics   . Alcohol use: Not Currently     Comment: 1-2x per year   . Drug use: Not Currently     Types: Marijuana     Comment: 1-2x a monthy       Family History   Problem Relation Name Age of Onset   . Hypertension Father     . Breast cancer Maternal Grandfather         REVIEW OF SYSTEMS:  Review of Systems   Constitutional: Negative for  activity change and appetite change.   HENT: Negative for congestion and ear pain.    Respiratory: Positive for cough. Negative for chest tightness, shortness of breath and wheezing.    Cardiovascular: Negative for chest pain and palpitations.   Gastrointestinal: Negative for constipation and diarrhea.   Skin: Negative for color change.   Neurological: Negative for dizziness.   Psychiatric/Behavioral: Negative for behavioral problems. The patient is not nervous/anxious.        PHYSICAL EXAM:  Vitals:    06/13/22 1557   BP: 126/83   BP Location: Left arm   Patient Position: Sitting   Pulse: 76   Temp: 36.7 C (98.1 F)   TempSrc: Oral   SpO2: 98%   Weight: 70.3 kg   Height: 1.6 m       Physical Exam  Constitutional:       Appearance: Normal appearance.   HENT:      Head: Normocephalic and atraumatic.      Left Ear: A middle ear effusion is present.   Cardiovascular:      Rate and Rhythm: Normal rate and regular rhythm.      Pulses: Normal pulses.      Heart sounds: Normal heart sounds.   Pulmonary:      Effort: Pulmonary effort is normal.      Breath sounds: Normal breath sounds.   Skin:     General: Skin is warm and dry.   Neurological:       General: No focal deficit present.      Mental Status: She is alert and oriented to person, place, and time.   Psychiatric:         Mood and Affect: Mood normal.         Behavior: Behavior normal.         LABS:  Office Visit on 06/13/2022   Component Date Value Ref Range Status   . POC hCG Qual, Ur 06/13/2022 Negative   Final       ASSESSMENT AND PLAN:  Diagnoses and all orders for this visit:  Amenorrhea  -     POCT pregnancy, urine  Acute cough  -     STAT SARS/FLU/RSV  -     benzonatate (Tessalon) 100 mg capsule; Take 1 capsule (100 mg) by mouth if needed in the morning, at noon, and at bedtime for cough. Do not crush or chew.     Will order stat FLU/COVID/RSV. Followup with results.  Continue with supportive management. Increase hydration, flonase as needed, cough medication rx sent tylenol for pain and fever. Follow up if symptoms worsening or as needed.     Vernelle Emerald, NP

## 2022-09-24 ENCOUNTER — Encounter: Payer: MEDICAID | Attending: Family | Primary: Internal Medicine

## 2022-10-08 ENCOUNTER — Ambulatory Visit
Admit: 2022-10-08 | Discharge: 2022-10-08 | Payer: MEDICAID | Attending: Physician Assistant | Primary: Internal Medicine

## 2022-10-08 DIAGNOSIS — K439 Ventral hernia without obstruction or gangrene: Secondary | ICD-10-CM

## 2022-10-08 MED ORDER — famotidine (Pepcid) 20 mg tablet
20 | ORAL_TABLET | Freq: Every day | ORAL | 0 refills | Status: AC
Start: 2022-10-08 — End: ?

## 2022-10-08 NOTE — Progress Notes (Signed)
[facility] - [place] [address]  [address]  [address]  [place] [address]  Dept: [phone]  Dept Fax: [phone]     Patient ID: Sophia Brewer is a 32 y.o. female who presents for Abdominal Pain.    Subjective   C/o gas and bloating , stomach discomfort . Get heart burns often   Noted bulging above belly button   Denies diarrhea , dark stools , vomiting          ROS     Patient Active Problem List   Diagnosis    History of abnormal cervical Pap smear    Vitamin D deficiency    Thyroid nodule (CMS-HCC)     Current Outpatient Medications   Medication Instructions    famotidine (PEPCID) 20 mg, oral, Daily     No Known Allergies    Objective   Visit Vitals  BP 120/78   Pulse 85   Ht 1.6 m   Wt 69.9 kg   SpO2 99%   BMI 27.28 kg/m   OB Status Recent pregnancy   BSA 1.76 m       Physical Exam  Constitutional:       General: She is not in acute distress.     Appearance: Normal appearance.   HENT:      Head: Normocephalic and atraumatic.   Cardiovascular:      Rate and Rhythm: Normal rate and regular rhythm.      Heart sounds: No murmur heard.  Pulmonary:      Breath sounds: Normal breath sounds. No wheezing, rhonchi or rales.   Abdominal:      General: There is no distension.      Tenderness: There is no abdominal tenderness. There is no guarding.      Hernia: A hernia is present.      Comments: Reducible hernia above the umbilicus    Musculoskeletal:      Cervical back: Neck supple.      Right lower leg: No edema.      Left lower leg: No edema.   Skin:     Findings: No rash.   Neurological:      General: No focal deficit present.      Mental Status: She is alert and oriented to person, place, and time.         Assessment/Plan   [name] was seen today for abdominal pain.  Ventral hernia without obstruction or gangrene  -     Ambulatory referral to General Surgery; Future  Gastroesophageal reflux disease without esophagitis  -     famotidine (Pepcid) 20 mg tablet; Take 1 tablet (20 mg) by mouth once  daily.  -     H. pylori antigen, stool; Future  Hernia above umbilicus easy reducible , discussed w pt risk of complications from hernias and recommend to see gen surgeon to discuss   For GERd will r/o H pylori   Pt originally from [place]   Will start pepcid , avoid acidic spicy food

## 2022-10-09 LAB — TSH WITH REFLEX: TSH: 2.48 u[IU]/mL (ref 0.450–4.500)

## 2022-10-10 ENCOUNTER — Encounter: Payer: MEDICAID | Attending: Student in an Organized Health Care Education/Training Program | Primary: Internal Medicine

## 2022-10-19 LAB — H. PYLORI ANTIGEN: H pylori Stool Ag EIA: POSITIVE — AB

## 2022-10-23 ENCOUNTER — Ambulatory Visit
Admit: 2022-10-23 | Discharge: 2022-10-23 | Payer: MEDICAID | Attending: Physician Assistant | Primary: Internal Medicine

## 2022-10-23 DIAGNOSIS — A048 Other specified bacterial intestinal infections: Secondary | ICD-10-CM

## 2022-10-23 MED ORDER — amoxicillin (Amoxil) 500 mg capsule
500 | ORAL_CAPSULE | Freq: Two times a day (BID) | ORAL | 0 refills | Status: DC
Start: 2022-10-23 — End: 2022-11-06

## 2022-10-23 MED ORDER — omeprazole (PriLOSEC) 20 mg DR capsule
20 | ORAL_CAPSULE | Freq: Two times a day (BID) | ORAL | 2 refills | 60.00000 days | Status: AC
Start: 2022-10-23 — End: 2022-11-22

## 2022-10-23 MED ORDER — clarithromycin (Biaxin) 500 mg tablet
500 | ORAL_TABLET | Freq: Two times a day (BID) | ORAL | 0 refills | 12.00000 days | Status: AC
Start: 2022-10-23 — End: 2022-11-06

## 2022-10-23 NOTE — Progress Notes (Signed)
[facility] - [facility] [address]  [address]  [address]  [place] [address]  Dept: [phone]  Dept Fax: [phone]     Patient ID: Sophia Brewer is a 32 y.o. female who presents for discuss treatments .    Subjective   Hx of GERD and burping a lot   Tested + H Pylori Ag stool   Here to discuss treatment  Will start clarithromycin/ amoxi and PPI   Ventral above umbilicus hernia has appt w Gen surg on 9/19          ROS     Patient Active Problem List   Diagnosis    History of abnormal cervical Pap smear    Vitamin D deficiency    Thyroid nodule (CMS-HCC)    Gastroesophageal reflux disease without esophagitis     Current Outpatient Medications   Medication Instructions    amoxicillin (AMOXIL) 1,000 mg, oral, Every 12 hours scheduled    clarithromycin (BIAXIN) 500 mg, oral, 2 times daily    famotidine (PEPCID) 20 mg, oral, Daily    omeprazole (PRILOSEC) 20 mg, oral, 2 times daily before meals, Do not crush or chew.     No Known Allergies    Objective   Visit Vitals  BP 123/80 (BP Location: Left arm, Patient Position: Sitting)   Pulse 83   Ht 1.6 m   Wt 68.9 kg   SpO2 99%   BMI 26.90 kg/m   OB Status Recent pregnancy   BSA 1.75 m       Physical Exam  Constitutional:       General: She is not in acute distress.     Appearance: Normal appearance.   Cardiovascular:      Rate and Rhythm: Normal rate and regular rhythm.   Pulmonary:      Breath sounds: Normal breath sounds.   Abdominal:      General: There is no distension.      Palpations: Abdomen is soft. There is no mass.      Tenderness: There is no abdominal tenderness.      Hernia: A hernia is present.   Musculoskeletal:      Cervical back: Neck supple.      Right lower leg: No edema.      Left lower leg: No edema.   Skin:     Findings: No rash.   Neurological:      General: No focal deficit present.      Mental Status: She is alert and oriented to person, place, and time.      Gait: Gait normal.         Assessment/Plan   Sophia Brewer was seen today for  discuss treatments .  Helicobacter pylori (H. pylori) infection  -     clarithromycin (Biaxin) 500 mg tablet; Take 1 tablet (500 mg) by mouth twice daily for 14 days.  -     amoxicillin (Amoxil) 500 mg capsule; Take 2 capsules (1,000 mg) by mouth every 12 (twelve) hours for 14 days.  -     omeprazole (PriLOSEC) 20 mg DR capsule; Take 1 capsule (20 mg) by mouth before breakfast and before evening meal. Do not crush or chew.  -     H. pylori antigen, stool; Future  Start treatment   Avoid acidic/ spicy food   Will repeat test in about 2 mo , advised to be off omeprazole x 2 weeks before the test   Can use in that period  prn pepcid

## 2022-11-06 ENCOUNTER — Ambulatory Visit
Admit: 2022-11-06 | Discharge: 2022-11-06 | Payer: MEDICAID | Attending: Physician Assistant | Primary: Internal Medicine

## 2022-11-06 DIAGNOSIS — Z0000 Encounter for general adult medical examination without abnormal findings: Secondary | ICD-10-CM

## 2022-11-06 NOTE — Progress Notes (Signed)
[facility] - [place] [address]  [address]  [address]  [place] [address]  Dept: [phone]  Dept Fax: [phone]     Patient ID: Sophia Brewer is a 32 y.o. female who presents for Annual Exam.    Subjective   Pt here for annual exam  On 9/15 she started treatment for H. Pylori with amoxicillin/clindamycin//omeprazole  Will repeat H. pylori stool test around November 1 (aware to be off PPIs for at least 2 weeks at the time of repeat stool test)  Tomorrow she has appointment with general surgery for abdominal hernia, this sometimes is bulging and painful.  She has 3 children's, youngest  about 36 years old  She has regular periods but more recently they have become heavier than before, last about 7 days  Recommend to continue monitor, she did discuss with GYN regarding these issue w her GYN in 01/2022 , not interested in birth control pill   Discussed  possibility to get IUD with GYN, if interested to get IUD advised to see GYN           ROS     Patient Active Problem List   Diagnosis    History of abnormal cervical Pap smear    Vitamin D deficiency    Thyroid nodule (CMS-HCC)    Gastroesophageal reflux disease without esophagitis     Current Outpatient Medications   Medication Instructions    clarithromycin (BIAXIN) 500 mg, oral, 2 times daily    famotidine (PEPCID) 20 mg, oral, Daily    omeprazole (PRILOSEC) 20 mg, oral, 2 times daily before meals, Do not crush or chew.     No Known Allergies    Objective   Visit Vitals  BP 114/73   Pulse 97   Ht 1.6 m   Wt 66.7 kg   SpO2 98%   BMI 26.04 kg/m   OB Status Recent pregnancy   BSA 1.72 m     Generalized Anxiety Disorder Screening - GAD-7 Score: 9  Interpretation: Positive screening.  Follow up & Intervention:  pt states she manage on her own this is normal for her and does not need ref for therapist or medication , will monitor w next visit          Patient Health Questionnaire-9 Score: 3   Interpretation: Negative screening.     Follow-up &  Interventions: Maintain annual screening - No additional Follow-up required        Physical Exam  Constitutional:       General: She is not in acute distress.     Appearance: Normal appearance.   HENT:      Head: Normocephalic and atraumatic.      Nose: No congestion or rhinorrhea.   Eyes:      Extraocular Movements: Extraocular movements intact.      Conjunctiva/sclera: Conjunctivae normal.   Cardiovascular:      Rate and Rhythm: Normal rate and regular rhythm.      Heart sounds: No murmur heard.  Pulmonary:      Breath sounds: Normal breath sounds. No wheezing, rhonchi or rales.   Abdominal:      General: There is no distension.      Palpations: Abdomen is soft. There is no mass.      Tenderness: There is no abdominal tenderness.      Hernia: A hernia is present.      Comments: Reducible hernia above the umbilicus    Musculoskeletal:  Cervical back: Neck supple.      Right lower leg: No edema.      Left lower leg: No edema.   Skin:     Findings: No rash.   Neurological:      General: No focal deficit present.      Mental Status: She is alert and oriented to person, place, and time.      Gait: Gait normal.   Psychiatric:         Mood and Affect: Mood normal.         Behavior: Behavior normal.         Assessment/Plan   Sophia Brewer was seen today for annual exam.  Annual physical exam  Comments:  annuel flu shot recommended  Orders:  -     CBC and differential; Future  -     Comprehensive metabolic panel; Future  Screening for cervical cancer  Comments:  Had normal  PAP 03/2021, and 2022.  due in 2026  Gastroesophageal reflux disease without esophagitis  Helicobacter pylori stool test positive  Comments:  Continue to finish 2 weeks of antibiotics and omeprazole.  Repeat test was discussed with patient  Screening for depression  Comments:  Negative  Ventral hernia without obstruction or gangrene  Comments:  appt w Gen surg on 9/19 /24  Follow-up in 1 year for physical exam, sooner as needed

## 2022-11-07 ENCOUNTER — Institutional Professional Consult (permissible substitution): Admit: 2022-11-07 | Discharge: 2022-11-07 | Payer: MEDICAID | Attending: Surgery | Primary: Internal Medicine

## 2022-11-07 DIAGNOSIS — K439 Ventral hernia without obstruction or gangrene: Secondary | ICD-10-CM

## 2022-11-07 LAB — COMPREHENSIVE METABOLIC PANEL
ALT: 9 IU/L (ref 0–32)
AST: 17 IU/L (ref 0–40)
Albumin: 4.2 g/dL (ref 4.0–5.0)
Alk Phosphatase: 48 IU/L (ref 44–121)
Anion Gap: 14 mmol/L (ref 10.0–18.0)
BUN/Creat Ratio: 10 (ref 9–23)
BUN: 8 mg/dL (ref 6–20)
Bili Total: 0.3 mg/dL (ref 0.0–1.2)
Calcium: 9.3 mg/dL (ref 8.7–10.2)
Carbon Dioxide: 20 mmol/L (ref 20–29)
Chloride: 108 mmol/L — ABNORMAL HIGH (ref 96–106)
Creat: 0.79 mg/dL (ref 0.57–1.00)
Globulin Total: 3.2 g/dL (ref 1.5–4.5)
Glucose: 84 mg/dL (ref 70–99)
Potassium: 4.2 mmol/L (ref 3.5–5.2)
Protein Total: 7.4 g/dL (ref 6.0–8.5)
Sodium: 142 mmol/L (ref 134–144)
eGFR: 103 mL/min/{1.73_m2} (ref >59)

## 2022-11-07 LAB — CBC W/DIFF
Baso Abs: 0 10*3/uL (ref 0.0–0.2)
Basos: 1 %
Eos Abs: 0.1 10*3/uL (ref 0.0–0.4)
Eos: 2 %
Hct: 37.4 % (ref 34.0–46.6)
Hgb: 11.9 g/dL (ref 11.1–15.9)
Immature Grans Abs: 0 10*3/uL (ref 0.0–0.1)
Immature Granulocytes: 0 %
Lymphs Abs: 2.5 10*3/uL (ref 0.7–3.1)
Lymphs: 48 %
MCH: 26.6 pg (ref 26.6–33.0)
MCHC: 31.8 g/dL (ref 31.5–35.7)
MCV: 84 fL (ref 79–97)
Monocytes: 7 %
MonocytesAbs: 0.4 10*3/uL (ref 0.1–0.9)
Neutrophils Abs: 2.1 10*3/uL (ref 1.4–7.0)
Neutrophils: 42 %
Platelets: 258 10*3/uL (ref 150–450)
RBC: 4.47 x10E6/uL (ref 3.77–5.28)
RDW: 12.6 % (ref 11.7–15.4)
WBC: 5.1 10*3/uL (ref 3.4–10.8)

## 2022-11-07 LAB — TSH WITH REFLEX: TSH: 0.974 u[IU]/mL (ref 0.450–4.500)

## 2022-11-07 NOTE — Progress Notes (Signed)
[facility], [facility] [place]  [address]  [place] [address]  Dept: [phone]  Dept Fax: [phone]     Patient ID: Sophia Brewer is a 32 y.o. female who presents for evaluation of ventral hernia       Subjective   HPI    32 year old female with no significant past medical history presents revelation of a ventral hernia.  Patient had a prior history of C-sections in January 2023.  Subsequent to that she had developed the swelling in the upper abdomen and the supraorbital region for over an year.  This was small to begin with and have gradually gotten bigger in size.  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  She described the pain as burning in nature.  Denies any nausea or vomiting.   No constipation.  Denies any prior hospital admissions for acute incarceration or strangulation of the swelling. The site of the swelling does not correspond to the incision that was used for the C-section.      Patient Active Problem List   Diagnosis    History of abnormal cervical Pap smear    Vitamin D deficiency    Thyroid nodule (CMS-HCC)    Gastroesophageal reflux disease without esophagitis       Objective   Visit Vitals  OB Status Recent pregnancy       Physical Exam    Exam     Constitutional:       Appearance:  Normal appearing.   HENT:      Head: Normocephalic and atraumatic.      Nose: Nose normal.      Mouth/Throat:      Mouth: Mucous membranes are moist.   Eyes:      Pupils: Pupils are equal, round, and reactive to light.   Cardiovascular:      Rate and Rhythm: Normal rate and regular rhythm.      Pulses: Normal pulses.      Heart sounds: Normal heart sounds.   Pulmonary:      Effort: Pulmonary effort is normal.   Abdominal:      Pfannensteil incision      General: There is no distension.      Tenderness: There is no abdominal tenderness.       No rebound       No signs of peritonitis   Musculoskeletal:         General: Normal range  of motion.      Cervical back: Normal range of motion.   Skin:     General: Skin is warm.   Psychiatric:         Mood and Affect: Mood normal.     -Large midline defect consistent with Diastasis   -No e/O Incisional  hernia     Assessment/Plan   Ventral hernia without obstruction or gangrene  -     Ambulatory referral to General Surgery    32 y.o. female referred for evaluation of ventral hernia. Exam is consistent with post-partum diastasis/ Ventral hernia.   Defect 12 x 10 cm in size    - CT abdomen and pelvis with IV contrast   - RTO after the CT scan to discuss.   - All questions answered   - She understands and agrees with the plan

## 2022-11-07 NOTE — Addendum Note (Signed)
Addended by: Lindajo Royal on: 12/31/2022 02:27 PM     Modules accepted: Orders

## 2022-11-07 NOTE — Telephone Encounter (Signed)
Patient calling for lab results     Patient saw PA on 11/06/22.  Please review and call

## 2022-11-08 NOTE — Telephone Encounter (Signed)
Pt aware.  SM.

## 2022-12-31 NOTE — Telephone Encounter (Signed)
Called pt and made her aware, left her detailed VM letting her know she needs to call that office.  SM.

## 2022-12-31 NOTE — Telephone Encounter (Signed)
MB is full, unable to leave VM.  SM.

## 2022-12-31 NOTE — Telephone Encounter (Signed)
Patient states she has seen Surgeon for Hernia back in Sept.    Patient was told she had to do a CT ABD/Pelvic with IV contrast for the Ventral Hernia.  Patient states that she has been calling to see if the CT has been scheduled, all she is getting from the staff is "the doctor has not put the order in"      Patient's consult with Surgeon was 11/07/22.  Patient states the hernia is getting bigger.     Please advise the patient     Thank you   -

## 2022-12-31 NOTE — Telephone Encounter (Signed)
Patient called surgeon and they ordered the CT ABD/Pelvic today.

## 2023-01-15 ENCOUNTER — Inpatient Hospital Stay: Admit: 2023-01-15 | Payer: MEDICAID | Primary: Internal Medicine

## 2023-01-15 DIAGNOSIS — K439 Ventral hernia without obstruction or gangrene: Secondary | ICD-10-CM

## 2023-01-15 MED ORDER — iohexol (OMNIPaque) 350 mg iodine/mL solution 100 mL
350 | Freq: Once | INTRAVENOUS | Status: AC
Start: 2023-01-15 — End: 2023-01-15
  Administered 2023-01-15: 20:00:00 100 mL via INTRAVENOUS

## 2023-08-27 ENCOUNTER — Inpatient Hospital Stay
Admit: 2023-08-27 | Disposition: A | Discharge status: Home / Self Care | Payer: Worker's Compensation | Arrived: VH | Primary: Internal Medicine

## 2023-08-27 DIAGNOSIS — W460XXA Contact with hypodermic needle, initial encounter: Principal | ICD-10-CM

## 2023-08-27 DIAGNOSIS — Z7721 Contact with and (suspected) exposure to potentially hazardous body fluids: Principal | ICD-10-CM

## 2023-08-27 MED ORDER — dolutegravir (Tivicay) 50 mg tablet - 7 day kit (~~LOC~~)
50 | Freq: Every day | ORAL | Status: DC
Start: 2023-08-27 — End: 2023-08-27
  Administered 2023-08-27: 23:00:00 50 mg via ORAL

## 2023-08-27 MED ORDER — dolutegravir 50 mg - 7 day (~~LOC~~) (Tivicay) 50 mg kit  - Omnicell Override Pull
50 | ORAL | Status: AC
Start: 2023-08-27 — End: ?

## 2023-08-27 MED ORDER — emtricitabine 200 mg-tenofovir disoproxil fumarate 300 mg tab (Truvada) - 7 day kit (~~LOC~~)
200-300 | Freq: Every day | ORAL | Status: DC
Start: 2023-08-27 — End: 2023-08-27
  Administered 2023-08-27: 23:00:00 1 via ORAL

## 2023-08-27 MED ORDER — emtricitabine 200 mg-tenofovir disoproxil fumarate 300 mg - 7 day (~~LOC~~) (Truvada) 200-300 mg kit  - Omnicell Override Pull
200-300 | ORAL | Status: AC
Start: 2023-08-27 — End: ?

## 2023-08-27 MED FILL — EMTRICITABINE 200 MG-TENOFOVIR DISOPROXIL FUMARATE 300 MG TAB - 7 DAY KIT ~~LOC~~: 200-300 200-300 mg | ORAL | Qty: 0.14 | Fill #0

## 2023-08-27 MED FILL — DOLUTEGRAVIR 50 MG TABLET - 7 DAY KIT ~~LOC~~: 50 50 mg | ORAL | Qty: 0.14 | Fill #0

## 2023-08-27 NOTE — Discharge Instructions (Addendum)
 Please make occupational medicine aware of source patient results when available.    Take postexposure prophylaxis medication as prescribed.    Follow-up with occupational medicine in 7 days

## 2023-08-27 NOTE — ED Provider Notes (Signed)
 [facility]  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  The patient was cannulated with a 15-gauge dialysis needle.      She reports the source patient had negative HIV, hepatitis B, hepatitis C testing performed within the last 2 years.  She reports the source patient was agreeable to have blood tested for blood-borne pathogens and these results are pending.        Problem List[1]    Past Medical History:   Diagnosis Date    Migraines          Past Surgical History:   Procedure Laterality Date    CESAREAN SECTION, LOW TRANSVERSE  2023        Family History[2]    Social History     Tobacco Use    Smoking status: Never     Passive exposure: Current    Smokeless tobacco: Never   Vaping Use    Vaping status: Never Used   Substance Use Topics    Alcohol use: Not Currently     Comment: 1-2x per year    Drug use: Not Currently     Types: Marijuana     Comment: 1-2x a monthy       No Known Allergies     Review of Systems   Constitutional:  Negative for fever.   Respiratory:  Negative for cough and shortness of breath.    Cardiovascular:  Negative for chest pain.   Skin:         Small superficial abrasion to right thumb        Physical Exam  Vitals:    08/27/23 1749 08/27/23 1750   BP:  106/72   BP Location:  Right arm   Patient Position:  Sitting   Pulse:  74   Resp:  18   Temp:  36.8 C (98.2 F)   TempSrc:  Oral   SpO2:  100%   Weight: 66.7 kg    Height: 1.6 m        Physical Exam  Vitals reviewed.   Cardiovascular:       Rate and Rhythm: Normal rate and regular rhythm.      Pulses: Normal pulses.      Heart sounds: Normal heart sounds.   Pulmonary:      Effort: Pulmonary effort is normal.      Breath sounds: Normal breath sounds.   Skin:     General: Skin is warm.      Capillary Refill: Capillary refill takes less than 2 seconds.      Comments: Small superficial linear abrasion to right thumb   Neurological:      General: No focal deficit present.      Mental Status: She is alert.              No orders to display     Labs Reviewed - No data to  display    Procedures  Procedures    Orders Placed This Encounter   Procedures    CBC and differential    Comprehensive metabolic panel    HCV Ab w/Reflex Quant PCR (screening)    Hepatitis B core antibody, total    Hepatitis B surface antibody    Hepatitis B surface antigen    HIV Ab/Ag screen      Medications Ordered This Encounter   Medications    dolutegravir  (Tivicay ) 50 mg tablet - 7 day kit [facility])    emtricitabine  200 mg-tenofovir  disoproxil fumarate 300 mg tab (Truvada ) - 7 day kit [facility])        UC Course  Diagnoses as of 08/27/23 1841   Accidental hypodermic needlestick injury with exposure to body fluid       Medical Decision Making  Patient with needlestick injury today.  Sustained a superficial abrasion to right thumb from dialysis needle which punctured through patient fistula.  GFR 103  Hepatitis B immunity last checked 2019    Source patient reported to be negative for hepatitis B, HIV, hepatitis C within the last 2 years.  Is being tested.      Testing today for CBC, CMP, hepatitis B, hepatitis C, HIV testing.  Patient provides verbal consent for HIV testing.    Prescription for postexposure prophylaxis given.  Patient is breast-feeding her 23-year-old.  Given the age of the child, recommend no change to postexposure prophylaxis treatment.     Patient will follow-up with occupational medicine in 7 days, if source patient is testing is available priorshe will call  occupational medicine for sooner follow-up.    Problems Addressed:  Accidental hypodermic needlestick injury with exposure to body fluid: complicated acute illness or injury    Amount and/or Complexity of Data Reviewed  Labs: ordered.    Risk  Prescription drug management.        Discharge Meds  ED Prescriptions    None         Home Meds  Prior to Admission medications   Medication Sig Start Date End Date Taking? Authorizing Provider   famotidine  (Pepcid ) 20 mg tablet Take 1 tablet (20 mg) by mouth once daily. 10/08/22   Olimpia Suciu, PA   omeprazole  (PriLOSEC) 20 mg DR capsule Take 1 capsule (20 mg) by mouth if needed each day (acid reflux or heartburns). Do not crush or chew. 01/27/23 04/27/23  Ree Browner, PA       Patient encounter note may have been created using voice recognition software and in real time during the office visit. Please excuse any typographical errors that may not have been edited out.                [1]   Patient Active Problem List  Diagnosis    History of abnormal cervical Pap smear    Vitamin D deficiency    Thyroid nodule     Gastroesophageal reflux disease without esophagitis   [2]   Family History  Problem Relation Name Age of Onset    Hypertension Father      Breast cancer Maternal Grandfather          Vicenta Avers, NP  08/27/23 [phone]

## 2023-08-29 LAB — HCV AB W/REFLEX QUANT PCR (SCREENING): HCV Ab: NONREACTIVE

## 2023-08-29 LAB — CBC W/DIFF
Baso Abs: 0 x10E3/uL (ref 0.0–0.2)
Basos: 0 %
Eos Abs: 0.1 x10E3/uL (ref 0.0–0.4)
Eos: 1 %
Hct: 32.7 % — ABNORMAL LOW (ref 34.0–46.6)
Hgb: 9.9 g/dL — ABNORMAL LOW (ref 11.1–15.9)
Immature Grans Abs: 0 x10E3/uL (ref 0.0–0.1)
Immature Granulocytes: 0 %
Lymphs Abs: 2.8 x10E3/uL (ref 0.7–3.1)
Lymphs: 60 %
MCH: 25.3 pg — ABNORMAL LOW (ref 26.6–33.0)
MCHC: 30.3 g/dL — ABNORMAL LOW (ref 31.5–35.7)
MCV: 83 fL (ref 79–97)
Monocytes: 6 %
MonocytesAbs: 0.3 x10E3/uL (ref 0.1–0.9)
Neutrophils Abs: 1.5 x10E3/uL (ref 1.4–7.0)
Neutrophils: 33 %
Platelets: 257 x10E3/uL (ref 150–450)
RBC: 3.92 x10E6/uL (ref 3.77–5.28)
RDW: 14 % (ref 11.7–15.4)
WBC: 4.7 x10E3/uL (ref 3.4–10.8)

## 2023-08-29 LAB — COMPREHENSIVE METABOLIC PANEL
ALT: 12 IU/L (ref 0–32)
AST: 17 IU/L (ref 0–40)
Albumin: 4.4 g/dL (ref 3.9–4.9)
Alk Phosphatase: 42 IU/L — ABNORMAL LOW (ref 44–121)
Anion Gap: 9 mmol/L — ABNORMAL LOW (ref 10.0–18.0)
BUN/Creat Ratio: 12 (ref 9–23)
BUN: 8 mg/dL (ref 6–20)
Bili Total: <0.2 mg/dL (ref 0.0–1.2)
Calcium: 9.1 mg/dL (ref 8.7–10.2)
Carbon Dioxide: 24 mmol/L (ref 20–29)
Chloride: 106 mmol/L (ref 96–106)
Creat: 0.65 mg/dL (ref 0.57–1.00)
Globulin Total: 2.7 g/dL (ref 1.5–4.5)
Glucose: 90 mg/dL (ref 70–99)
Potassium: 4.2 mmol/L (ref 3.5–5.2)
Protein Total: 7.1 g/dL (ref 6.0–8.5)
Sodium: 139 mmol/L (ref 134–144)
eGFR: 121 mL/min/1.73 (ref >59)

## 2023-08-29 LAB — HEPATITIS B CORE ANTIBODY, TOTAL: Hep B Core Total Ab: POSITIVE — AB

## 2023-08-29 LAB — HCV QN INTERP (REFLEX)

## 2023-08-29 LAB — HEPATITIS B SURFACE ANTIBODY QUANT: Hep B Surf Ab Qn: 348 m[IU]/mL

## 2023-08-29 LAB — HEPATITIS B SURFACE ANTIGEN: Hep B Surf Ag Scr: NEGATIVE

## 2023-08-29 LAB — HIV AB/AG SCREEN: HIV Scr 4th Gen: NONREACTIVE

## 2023-08-29 NOTE — Result Quicknote (Signed)
 1st attempt -no answer unable to leave a VM - no box has been set up

## 2023-08-29 NOTE — Result Quicknote (Signed)
 HBV core antibody positive, surface antibody indicates immunity.  This can indicate prior encounter with Hepatitis B virus, without active infection.

## 2023-08-30 NOTE — Result Quicknote (Signed)
 2nd Attempt: LVM requesting callback. Left DUC direct number

## 2023-09-01 NOTE — Result Quicknote (Signed)
 Spoke with pt, name and DOB confirmed at start of conversation. Pt notified of results.  Pt states source patient's labs returned negative for all blood borne pathogens and inquires if she needs to continue medication.  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

## 2023-09-17 ENCOUNTER — Inpatient Hospital Stay
Admit: 2023-09-17 | Disposition: A | Discharge status: Home / Self Care | Payer: Worker's Compensation | Arrived: VH | Attending: Adult Health | Primary: Internal Medicine

## 2023-09-17 DIAGNOSIS — M542 Cervicalgia: Principal | ICD-10-CM

## 2023-09-17 NOTE — Discharge Instructions (Signed)
 Rest today, return to work tomorrow, light duty for 2 days.  Contact occupational medicine for follow-up evaluation  Over-the-counter as needed for symptom relief  Return to clinic as needed or follow-up with your PCP  ED if symptoms worsen/become severe

## 2023-09-17 NOTE — ED Provider Notes (Signed)
 [facility]  [address]  [place] [address]     History  Chief Complaint   Patient presents with    Worker's Compensation     Yesterday States slipped and fell on water  at work  - slipped grabbed wall and fell down on R side - denies hitting head or LOC - c/o R side deck pain , R hip, R hand ring finger  - used ibuprofen  6am + effects -      33 year old female presents urgent care with right sided neck hip and right ring finger pain.  Patient reports history of right finger pain however exacerbated by fall yesterday at work.  She reports she was walking into her bathroom and slipped on water .  She did not hit her head or lose consciousness.  She completed her shift, woke up this morning and noted discomfort.  Using Tylenol  intermittently, last dose was this morning.  Reports taking 2 doses yesterday.  No numbness or tingling she is due to return to work tomorrow      History provided by:  Patient  Language interpreter used: No      Problem List[1]    Past Medical History:   Diagnosis Date    Migraines          Past Surgical History:   Procedure Laterality Date    CESAREAN SECTION, LOW TRANSVERSE  2023        Family History[2]    Social History     Tobacco Use    Smoking status: Never     Passive exposure: Never    Smokeless tobacco: Never   Vaping Use    Vaping status: Never Used   Substance Use Topics    Alcohol use: Not Currently     Comment: 1-2x per year    Drug use: Not Currently     Types: Marijuana     Comment: 1-2x a monthy       No Known Allergies     Review of Systems   All other systems reviewed and are negative.      Physical Exam  Vitals:    09/17/23 1332 09/17/23 1336   BP:  113/72   Pulse:  89   Resp:  18   Temp:  37.4 C (99.4 F)   TempSrc:  Oral   SpO2:  100%   Weight: 68 kg    Height: 1.6 m        Physical Exam  Vitals and nursing note reviewed.   Constitutional:       Appearance: Normal appearance.   HENT:      Head: Normocephalic and atraumatic.      Nose: Nose  normal.      Mouth/Throat:      Mouth: Mucous membranes are moist.   Eyes:      Conjunctiva/sclera: Conjunctivae normal.      Pupils: Pupils are equal, round, and reactive to light.   Cardiovascular:      Rate and Rhythm: Normal rate and regular rhythm.      Pulses: Normal pulses.   Pulmonary:      Effort: Pulmonary effort is normal.   Abdominal:      General: Abdomen is flat.   Musculoskeletal:         General: Tenderness present. No swelling. Normal range of motion.      Cervical back: Normal range of motion and neck supple.      Comments: Tenderness to right  side of the trapezius, no bony tenderness, tenderness to lateral hip without swelling or ecchymosis.  Right ring finger is without swelling, capillary refills normal, has full range of motion.  No deformity.   Skin:     General: Skin is warm and dry.   Neurological:      Mental Status: She is alert.              No orders to display     Labs Reviewed - No data to display    Procedures  Procedures    No Orders were placed on this patient.      No Medications were ordered during this encounter.        UC Course  Diagnoses as of 09/17/23 1931   Neck pain   Right arm pain   Right leg pain   Pain in right finger(s)       Medical Decision Making  33 year old female in no acute distress, patient fell yesterday while at work after slipping on walk, did not hit her head, did not lose consciousness.  Pain with movement, improves with rest.  No paresthesias.  Upon entering exam room patient is lying on her left side, asleep, arousable to touch/voice.  she is able to push herself up, rotate around to sit on her buttocks.  She has full range of motion of her upper lower body extremities neck and back.  Tenderness over trapezius muscle, no pain over bony processes.  Normal DTRs, normal capillary refill, no deformities noted.  Equal strength bilaterally and peripheral pulses intact.  Patient was observed walking in and out of urgent care and walks with a steady gait,  unassisted, no limp.  No saddle numbness.  Light duty, reevaluation with occupational medicine discussed, OTC and close monitoring, ED if symptoms worsen/become severe.  Patient verbalized understanding of discharge instruction symptoms and is agreeable to plan    Problems Addressed:  Neck pain: acute illness or injury  Pain in right finger(s): acute illness or injury  Right arm pain: acute illness or injury  Right leg pain: acute illness or injury        Discharge Meds  ED Prescriptions    None         Home Meds  Prior to Admission medications   Medication Sig Start Date End Date Taking? Authorizing Provider   famotidine  (Pepcid ) 20 mg tablet Take 1 tablet (20 mg) by mouth once daily.  Patient not taking: Reported on 08/27/2023 10/08/22   Olimpia Suciu, PA   omeprazole  (PriLOSEC) 20 mg DR capsule Take 1 capsule (20 mg) by mouth if needed each day (acid reflux or heartburns). Do not crush or chew.  Patient not taking: Reported on 09/17/2023 01/27/23 04/27/23  Ree Browner, PA       Patient encounter note may have been created using voice recognition software and in real time during the office visit. Please excuse any typographical errors that may not have been edited out.              Suzen Dunk, NP  09/17/23 1930         [1]   Patient Active Problem List  Diagnosis    History of abnormal cervical Pap smear    Vitamin D deficiency    Thyroid nodule     Gastroesophageal reflux disease without esophagitis   [2]   Family History  Problem Relation Name Age of Onset    Hypertension Father      Breast  cancer Maternal Grandfather          Suzen Dunk, NP  09/17/23 1931

## 2023-11-10 ENCOUNTER — Encounter: Payer: MEDICAID | Attending: Internal Medicine | Primary: Internal Medicine

## 2023-11-10 DIAGNOSIS — Z0000 Encounter for general adult medical examination without abnormal findings: Principal | ICD-10-CM

## 2023-11-10 NOTE — Progress Notes (Deleted)
 Date: 11/10/2023      Patient:Sophia Brewer  DOB:June 03, 1990    [facility]  ELEANORE BLANCH, MD  OFFICE PHONE: [phone]  OFFICE FAX: [phone]    If you are the patient reviewing the medical notes, please keep in mind that medical notes are often written with abbreviations and medical terminology.  Provider documentation is typically for other providers to review in order to document patient's history/progress/test ordered/performed/interpreted in the most efficient manner.  This notes are made available for patients to review but are not written specifically with patient consumption in mind.    CHIEF COMPLAINT:    HISTORY OF PRESENT ILLNESS:    Sophia Brewer is a 33 y.o. female who presents for annual visit today  HEALTH MAINTENANCE:    Immunizations:          Health Maintenance   Topic Date Due    Varicella Vaccines (1 of 2 - 13+ 2-dose series) Never done    Depression Screening  02/19/2023    COVID-19 Vaccine (3 - 2025-26 season) 10/20/2023    Influenza Vaccine (1) 10/20/2023    Cervical Cancer Screening  04/13/2024    DTaP/Tdap/Td Vaccines (5 - Td or Tdap) 10/20/2029    Hepatitis B Vaccines  Completed    IPV Vaccines  Completed    MMR Vaccines  Completed    HPV Vaccines  Completed    HIV Screening  Completed    Hepatitis C Screening  Completed    HIB Vaccines  Aged Out    Hepatitis A Vaccines  Aged Out    Meningococcal Vaccine  Aged Out    Rotavirus Vaccines  Aged Out    Pneumococcal Vaccine: Pediatrics (0 to 5 Years) and At-Risk Patients (6 to 46 Years)  Aged Out    Meningococcal B Vaccine  Aged Out       Medical History[1]    Surgical History[2]    Current Medications[3]    Allergies[4]    Social History[5]    Family History[6]    REVIEW OF SYSTEMS:  Review of Systems    PHYSICAL EXAM:  There were no vitals filed for this visit.    Physical Exam    LABS:  No visits with results within 30 Day(s) from this visit.   Latest known visit with results is:   Admission on 08/27/2023, Discharged on  08/27/2023   Component Date Value Ref Range Status    WBC 08/27/2023 4.7  3.4 - 10.8 x10E3/uL Final    RBC 08/27/2023 3.92  3.77 - 5.28 x10E6/uL Final    Hgb 08/27/2023 9.9 (L)  11.1 - 15.9 g/dL Final    Hct 92/90/7974 32.7 (L)  34.0 - 46.6 % Final    MCV 08/27/2023 83  79 - 97 fL Final    MCH 08/27/2023 25.3 (L)  26.6 - 33.0 pg Final    MCHC 08/27/2023 30.3 (L)  31.5 - 35.7 g/dL Final    RDW 92/90/7974 14.0  11.7 - 15.4 % Final    Platelets 08/27/2023 257  150 - 450 x10E3/uL Final    Neutrophils 08/27/2023 33  Not Estab. % Final    Lymphs 08/27/2023 60  Not Estab. % Final    Monocytes 08/27/2023 6  Not Estab. % Final    Eos 08/27/2023 1  Not Estab. % Final    Basos 08/27/2023 0  Not Estab. % Final    Neutrophils Abs 08/27/2023 1.5  1.4 - 7.0 x10E3/uL Final    Lymphs Abs 08/27/2023 2.8  0.7 - 3.1 x10E3/uL Final    MonocytesAbs 08/27/2023 0.3  0.1 - 0.9 x10E3/uL Final    Eos Abs 08/27/2023 0.1  0.0 - 0.4 x10E3/uL Final    Baso Abs 08/27/2023 0.0  0.0 - 0.2 x10E3/uL Final    Immature Granulocytes 08/27/2023 0  Not Estab. % Final    Immature Grans Abs 08/27/2023 0.0  0.0 - 0.1 x10E3/uL Final    Glucose 08/27/2023 90  70 - 99 mg/dL Final    BUN 92/90/7974 8  6 - 20 mg/dL Final    Creat 92/90/7974 0.65  0.57 - 1.00 mg/dL Final    eGFR 92/90/7974 121  >59 mL/min/1.73 Final    BUN/Creat Ratio 08/27/2023 12  9 - 23 Final    Sodium 08/27/2023 139  134 - 144 mmol/L Final    Potassium 08/27/2023 4.2  3.5 - 5.2 mmol/L Final    Chloride 08/27/2023 106  96 - 106 mmol/L Final    Anion Gap 08/27/2023 9.0 (L)  10.0 - 18.0 mmol/L Final    Carbon Dioxide 08/27/2023 24  20 - 29 mmol/L Final    Calcium 08/27/2023 9.1  8.7 - 10.2 mg/dL Final    Protein Total 08/27/2023 7.1  6.0 - 8.5 g/dL Final    Albumin 92/90/7974 4.4  3.9 - 4.9 g/dL Final    Globulin Total 08/27/2023 2.7  1.5 - 4.5 g/dL Final    Bili Total 92/90/7974 <0.2  0.0 - 1.2 mg/dL Final    Alk Phosphatase 08/27/2023 42 (L)  44 - 121 IU/L Final    AST 08/27/2023 17  0 - 40 IU/L  Final    ALT 08/27/2023 12  0 - 32 IU/L Final    Hep B Surf Ab Qn 08/27/2023 348.0  Immunity>10 mIU/mL Final    Comment:   Status of Immunity                     Anti-HBs Level    ------------------                     --------------  Inconsistent with Immunity                  0.0 - 10.0  Consistent with Immunity                         >10.0      Hep B Surf Ag Scr 08/27/2023 Negative  Negative Final    HIV Scr 4th Gen 08/27/2023 Non Reactive  Non Reactive Final    Comment: HIV-1/HIV-2 antibodies and HIV-1 p24 antigen were NOT detected.  There is no laboratory evidence of HIV infection.  HIV Negative      HCV Ab 08/27/2023 Non Reactive  Non Reactive Final    HCV Neg Interp 08/27/2023    Final    Comment: Not infected with HCV unless early or acute infection is  suspected (which may be delayed in an immunocompromised  individual), or other evidence exists to indicate HCV infection.      Hep B Core Total Ab 08/27/2023 Positive (A)  Negative Final       ASSESSMENT AND PLAN:  Annual physical exam  Gastroesophageal reflux disease without esophagitis  History of abnormal cervical Pap smear  Thyroid nodule   Vitamin D deficiency      ELEANORE BLANCH, MD    OFFICE PHONE: [phone]  OFFICE FAX: [phone]    11/10/2023         [  1]   Past Medical History:  Diagnosis Date    Migraines     [2]   Past Surgical History:  Procedure Laterality Date    CESAREAN SECTION, LOW TRANSVERSE  2023   [3]   Current Outpatient Medications   Medication Sig Dispense Refill    famotidine  (Pepcid ) 20 mg tablet Take 1 tablet (20 mg) by mouth once daily. (Patient not taking: Reported on 08/27/2023) 90 tablet 0    omeprazole  (PriLOSEC) 20 mg DR capsule Take 1 capsule (20 mg) by mouth if needed each day (acid reflux or heartburns). Do not crush or chew. (Patient not taking: Reported on 09/17/2023) 90 capsule 0     No current facility-administered medications for this visit.   [4] No Known Allergies  [5]   Social History  Tobacco Use    Smoking status:  Never     Passive exposure: Never    Smokeless tobacco: Never   Vaping Use    Vaping status: Never Used   Substance Use Topics    Alcohol use: Not Currently     Comment: 1-2x per year    Drug use: Not Currently     Types: Marijuana     Comment: 1-2x a monthy   [6]   Family History  Problem Relation Name Age of Onset    Hypertension Father      Breast cancer Maternal Grandfather

## 2023-11-20 ENCOUNTER — Encounter: Payer: PRIVATE HEALTH INSURANCE | Primary: Internal Medicine

## 2023-11-20 NOTE — Progress Notes (Deleted)
 Date: 11/20/2023      Patient:Sophia Brewer  DOB:1990/11/06    [facility]  OFFICE PHONE: [phone]  OFFICE FAX: [phone]    If you are the patient reviewing the medical notes, please keep in mind that medical notes are often written with abbreviations and medical terminology.  Provider documentation is typically for other providers to review in order to document patient's history/progress/test ordered/performed/interpreted in the most efficient manner.  This notes are made available for patients to review but are not written specifically with patient consumption in mind.    CHIEF COMPLAINT    HISTORY OF PRESENT ILLNESS:    Jannah Guardiola is a 33 y.o. female who presents today for annual visit    Feeling well. No complaints.     ***previous h pylori issue, only say if she said anything   ***ask if still breastfeeding     Has 3 children.     HEALTH MAINTENANCE:  Influenza recommended***  Tdap 2021  Pap 03/2021 negative    Patient Health Questionnaire-9 Score: 7   Interpretation: Positive screening.     Follow-up & Interventions:   {Depression f/u:38315}        Health Maintenance   Topic Date Due    Varicella Vaccines (1 of 2 - 13+ 2-dose series) Never done    COVID-19 Vaccine (3 - 2025-26 season) 10/20/2023    Influenza Vaccine (1) 10/20/2023    Cervical Cancer Screening  04/13/2024    DTaP/Tdap/Td Vaccines (5 - Td or Tdap) 10/20/2029    Depression Screening  Completed    Hepatitis B Vaccines  Completed    IPV Vaccines  Completed    MMR Vaccines  Completed    HPV Vaccines  Completed    HIV Screening  Completed    Hepatitis C Screening  Completed    HIB Vaccines  Aged Out    Hepatitis A Vaccines  Aged Out    Meningococcal Vaccine  Aged Out    Rotavirus Vaccines  Aged Out    Pneumococcal Vaccine: Pediatrics (0 to 5 Years) and At-Risk Patients (6 to 18 Years)  Aged Out    Meningococcal B Vaccine  Aged Out       Medical History[1]    Surgical History[2]    Current  Medications[3]    Allergies[4]    Social History[5]    Family History[6]    Review of Systems   Constitutional:  Negative for fatigue and fever.   Eyes:  Negative for visual disturbance.   Respiratory:  Negative for cough, shortness of breath and wheezing.    Cardiovascular:  Negative for chest pain, palpitations and leg swelling.   Gastrointestinal:  Negative for abdominal pain, constipation, diarrhea and nausea.   Genitourinary:  Negative for dysuria.   Skin:  Negative for rash.   Neurological:  Negative for headaches.         There were no vitals filed for this visit.    Physical Exam  Constitutional:       Appearance: Normal appearance.   HENT:      Right Ear: Tympanic membrane normal.      Left Ear: Tympanic membrane normal.      Mouth/Throat:      Mouth: Mucous membranes are moist.   Eyes:      Extraocular Movements: Extraocular movements intact.      Pupils: Pupils are equal, round, and reactive to light.   Neck:      Vascular: No carotid bruit.   Cardiovascular:  Rate and Rhythm: Normal rate and regular rhythm.      Pulses: Normal pulses.      Heart sounds: Normal heart sounds. No murmur heard.  Pulmonary:      Breath sounds: Normal breath sounds. No wheezing, rhonchi or rales.   Abdominal:      General: Abdomen is flat.      Palpations: Abdomen is soft.      Tenderness: There is no abdominal tenderness.   Musculoskeletal:      Cervical back: Normal range of motion.      Right lower leg: No edema.      Left lower leg: No edema.   Lymphadenopathy:      Cervical: No cervical adenopathy.   Skin:     General: Skin is warm.      Findings: No rash.   Neurological:      General: No focal deficit present.      Mental Status: She is alert and oriented to person, place, and time.      Sensory: Sensation is intact.      Motor: Motor function is intact.      Gait: Gait is intact.   Psychiatric:         Mood and Affect: Mood normal.           LABS:  No visits with results within 30 Day(s) from this visit.   Latest  known visit with results is:   Admission on 08/27/2023, Discharged on 08/27/2023   Component Date Value Ref Range Status    WBC 08/27/2023 4.7  3.4 - 10.8 x10E3/uL Final    RBC 08/27/2023 3.92  3.77 - 5.28 x10E6/uL Final    Hgb 08/27/2023 9.9 (L)  11.1 - 15.9 g/dL Final    Hct 92/90/7974 32.7 (L)  34.0 - 46.6 % Final    MCV 08/27/2023 83  79 - 97 fL Final    MCH 08/27/2023 25.3 (L)  26.6 - 33.0 pg Final    MCHC 08/27/2023 30.3 (L)  31.5 - 35.7 g/dL Final    RDW 92/90/7974 14.0  11.7 - 15.4 % Final    Platelets 08/27/2023 257  150 - 450 x10E3/uL Final    Neutrophils 08/27/2023 33  Not Estab. % Final    Lymphs 08/27/2023 60  Not Estab. % Final    Monocytes 08/27/2023 6  Not Estab. % Final    Eos 08/27/2023 1  Not Estab. % Final    Basos 08/27/2023 0  Not Estab. % Final    Neutrophils Abs 08/27/2023 1.5  1.4 - 7.0 x10E3/uL Final    Lymphs Abs 08/27/2023 2.8  0.7 - 3.1 x10E3/uL Final    MonocytesAbs 08/27/2023 0.3  0.1 - 0.9 x10E3/uL Final    Eos Abs 08/27/2023 0.1  0.0 - 0.4 x10E3/uL Final    Baso Abs 08/27/2023 0.0  0.0 - 0.2 x10E3/uL Final    Immature Granulocytes 08/27/2023 0  Not Estab. % Final    Immature Grans Abs 08/27/2023 0.0  0.0 - 0.1 x10E3/uL Final    Glucose 08/27/2023 90  70 - 99 mg/dL Final    BUN 92/90/7974 8  6 - 20 mg/dL Final    Creat 92/90/7974 0.65  0.57 - 1.00 mg/dL Final    eGFR 92/90/7974 121  >59 mL/min/1.73 Final    BUN/Creat Ratio 08/27/2023 12  9 - 23 Final    Sodium 08/27/2023 139  134 - 144 mmol/L Final    Potassium 08/27/2023 4.2  3.5 -  5.2 mmol/L Final    Chloride 08/27/2023 106  96 - 106 mmol/L Final    Anion Gap 08/27/2023 9.0 (L)  10.0 - 18.0 mmol/L Final    Carbon Dioxide 08/27/2023 24  20 - 29 mmol/L Final    Calcium 08/27/2023 9.1  8.7 - 10.2 mg/dL Final    Protein Total 08/27/2023 7.1  6.0 - 8.5 g/dL Final    Albumin 92/90/7974 4.4  3.9 - 4.9 g/dL Final    Globulin Total 08/27/2023 2.7  1.5 - 4.5 g/dL Final    Bili Total 92/90/7974 <0.2  0.0 - 1.2 mg/dL Final    Alk Phosphatase  08/27/2023 42 (L)  44 - 121 IU/L Final    AST 08/27/2023 17  0 - 40 IU/L Final    ALT 08/27/2023 12  0 - 32 IU/L Final    Hep B Surf Ab Qn 08/27/2023 348.0  Immunity>10 mIU/mL Final    Comment:   Status of Immunity                     Anti-HBs Level    ------------------                     --------------  Inconsistent with Immunity                  0.0 - 10.0  Consistent with Immunity                         >10.0      Hep B Surf Ag Scr 08/27/2023 Negative  Negative Final    HIV Scr 4th Gen 08/27/2023 Non Reactive  Non Reactive Final    Comment: HIV-1/HIV-2 antibodies and HIV-1 p24 antigen were NOT detected.  There is no laboratory evidence of HIV infection.  HIV Negative      HCV Ab 08/27/2023 Non Reactive  Non Reactive Final    HCV Neg Interp 08/27/2023    Final    Comment: Not infected with HCV unless early or acute infection is  suspected (which may be delayed in an immunocompromised  individual), or other evidence exists to indicate HCV infection.      Hep B Core Total Ab 08/27/2023 Positive (A)  Negative Final       ASSESSMENT AND PLAN:  Annual physical exam  Comments:  Normal exam. Continue healthy diet and exercise. Will review labs once completed.  Orders:  -     CBC; Future  -     Comprehensive metabolic panel; Future  -     Lipid panel; Future  -     TSH with reflex; Future  -     Urinalysis reflex to culture; Future  -     Vit D 25 hydroxy; Future  Other fatigue  -     TSH with reflex; Future  Vitamin D deficiency  -     Vit D 25 hydroxy; Future      Russell Blanch, NP    OFFICE PHONE: [phone]  OFFICE FAX: [phone]    11/20/2023         [1]   Past Medical History:  Diagnosis Date    Migraines     [2]   Past Surgical History:  Procedure Laterality Date    CESAREAN SECTION, LOW TRANSVERSE  2023   [3]   Current Outpatient Medications   Medication Sig Dispense Refill    famotidine  (Pepcid ) 20 mg tablet  Take 1 tablet (20 mg) by mouth once daily. (Patient not taking: Reported on 08/27/2023) 90 tablet 0     omeprazole  (PriLOSEC) 20 mg DR capsule Take 1 capsule (20 mg) by mouth if needed each day (acid reflux or heartburns). Do not crush or chew. (Patient not taking: Reported on 09/17/2023) 90 capsule 0     No current facility-administered medications for this visit.   [4] No Known Allergies  [5]   Social History  Tobacco Use    Smoking status: Never     Passive exposure: Never    Smokeless tobacco: Never   Vaping Use    Vaping status: Never Used   Substance Use Topics    Alcohol use: Not Currently     Comment: 1-2x per year    Drug use: Not Currently     Types: Marijuana     Comment: 1-2x a monthy   [6]   Family History  Problem Relation Name Age of Onset    Hypertension Father      Breast cancer Maternal Grandfather

## 2023-11-25 ENCOUNTER — Ambulatory Visit
Admit: 2023-11-25 | Discharge: 2023-11-25 | Payer: PRIVATE HEALTH INSURANCE | Attending: Physician Assistant | Primary: Internal Medicine

## 2023-11-25 DIAGNOSIS — Z0000 Encounter for general adult medical examination without abnormal findings: Principal | ICD-10-CM

## 2023-11-25 MED ORDER — omeprazole (PriLOSEC) 20 mg DR capsule
20 | ORAL_CAPSULE | Freq: Every day | ORAL | 0 refills | 60.00000 days | Status: AC | PRN
Start: 2023-11-25 — End: 2024-02-23

## 2023-11-25 NOTE — Progress Notes (Cosign Needed)
 [facility]  [address]  [place] [address]  Dept: [phone]  Dept Fax: [phone]     Patient ID: Sophia Brewer is a 33 y.o. female who presents for Annual Exam.    Subjective  Here for annual     On 9 /2024 completed treatment for H. Pylori with amoxicillin /clindamycin//omeprazole   Did not have repeat of the H. pylori stool test--reordered stool kit provided to patient today  She has regular periods but more recently they have become heavier than before, last about 7 days.   She saw GYN and discussed birth control but patient states is not interested in either the pill or IUD  History of iron  deficiency anemia likely secondary to heavy periods     Rectus muscle diastases and ventral hernia was seen in consult by general surgery she did not fu w  Gen surg , she will reschedule      Sometimes neck pain   Noted vaginal itching, used monistat otc , itching is gone  Constipation +      Problem List[1]  Current Outpatient Medications   Medication Instructions    famotidine  (PEPCID ) 20 mg, oral, Daily    omeprazole  (PRILOSEC) 20 mg, oral, Daily PRN, Do not crush or chew.     Allergies[2]    Objective  Visit Vitals  BP 112/76 (BP Location: Left arm, Patient Position: Sitting, BP Cuff Size: Adult)   Pulse 84   Ht 1.6 m   Wt 66.9 kg   SpO2 99%   BMI 26.12 kg/m   OB Status Having periods   BSA 1.72 m     Physical Exam  Constitutional:       General: She is not in acute distress.     Appearance: Normal appearance.   HENT:      Head: Normocephalic and atraumatic.   Eyes:      Extraocular Movements: Extraocular movements intact.      Conjunctiva/sclera: Conjunctivae normal.   Cardiovascular:      Rate and Rhythm: Normal rate and regular rhythm.      Heart sounds: No murmur heard.  Pulmonary:      Breath sounds: Normal breath sounds. No wheezing, rhonchi or rales.   Abdominal:      General: There is no distension.      Palpations: Abdomen is soft.      Tenderness: There is no abdominal  tenderness.      Comments: Rectus muscle diastases, no hernia    Genitourinary:     General: Normal vulva.      Comments: Normal breast exam b/l  Musculoskeletal:      Cervical back: Neck supple.      Right lower leg: No edema.      Left lower leg: No edema.   Skin:     Findings: No rash.   Neurological:      General: No focal deficit present.      Mental Status: She is alert and oriented to person, place, and time.      Gait: Gait normal.         Assessment / Plan  Shawnee was seen today for annual exam.  Annual physical exam  Comments:  Recent labs reviewed,  will get TSH  Orders:  -     TSH with reflex; Future  Helicobacter pylori (H. pylori) infection  -     omeprazole  (PriLOSEC) 20 mg DR capsule; Take 1 capsule (20 mg) by  mouth if needed each day (acid reflux or heartburns). Do not crush or chew.  History of Helicobacter pylori infection  -     H. pylori antigen, stool; Future  Screening examination for STD (sexually transmitted disease)  -     Advanced Vaginitis Plus, TMA  Screening for cervical cancer  Comments:  Follows with GYN, had normal PAP in 2023  Anemia, unspecified type  -     Iron , Ferritin, TIBC; Future      Patient Health Questionnaire-9 Score: 7   Interpretation: Positive screening.     Follow-up & Interventions:   - Patient declines current active treatment plan: Will discuss at future appointment.  Generalized Anxiety Disorder Screening - GAD-7 Score: 1  Interpretation: Negative screening.  Follow up & Intervention: Maintain annual screening - No additional Follow-up required                  [1]   Patient Active Problem List  Diagnosis    History of abnormal cervical Pap smear    Vitamin D deficiency    Thyroid nodule    Gastroesophageal reflux disease without esophagitis   [2] No Known Allergies

## 2023-11-26 LAB — IRON, FERRITIN, TIBC
Ferritin: 12 ng/mL — ABNORMAL LOW (ref 15–150)
Iron Bind.Cap.(TIBC): 371 ug/dL (ref 250–450)
Iron Saturation: 39 % (ref 15–55)
Iron: 146 ug/dL (ref 27–159)
UIBC: 225 ug/dL (ref 131–425)

## 2023-11-26 LAB — TSH WITH REFLEX: TSH: 1.05 u[IU]/mL (ref 0.450–4.500)

## 2023-11-28 LAB — ADVANCED VAGINITIS PLUS, TMA
Candida albicans, NAA: NEGATIVE
Candida glabrata, NAA: NEGATIVE
Chlamydia trachomatis, NAA: NEGATIVE
Neisseria gonorrhoeae, NAA: NEGATIVE
Trich vag by NAA: NEGATIVE

## 2023-12-02 LAB — H. PYLORI ANTIGEN: H pylori Stool Ag EIA: NEGATIVE

## 2024-03-16 ENCOUNTER — Ambulatory Visit
Admit: 2024-03-16 | Discharge: 2024-03-16 | Payer: PRIVATE HEALTH INSURANCE | Attending: Student in an Organized Health Care Education/Training Program | Primary: Internal Medicine

## 2024-03-16 DIAGNOSIS — K5909 Other constipation: Principal | ICD-10-CM

## 2024-03-16 MED ORDER — LIDOCAINE 3 %-HYDROCORTISONE 0.5 % RECTAL CREAM
3-0.5 | Freq: Two times a day (BID) | RECTAL | 1 refills | 10.00000 days | Status: AC
Start: 2024-03-16 — End: ?

## 2024-03-16 MED ORDER — HYDROCORTISONE 2.5 % TOPICAL CREAM WITH PERINEAL APPLICATOR
2.5 | Freq: Four times a day (QID) | TOPICAL | 0 refills | 7.00000 days | Status: AC | PRN
Start: 2024-03-16 — End: 2025-03-16

## 2024-03-16 MED ORDER — DOCUSATE SODIUM 100 MG CAPSULE
100 | ORAL_CAPSULE | Freq: Two times a day (BID) | ORAL | 0 refills | 30.00000 days | Status: AC
Start: 2024-03-16 — End: ?

## 2024-03-16 NOTE — Progress Notes (Signed)
 [facility] [place] [address]  [address]  [address]  [place] [address]  Dept: [phone]  Dept Fax: [phone]     Patient ID: Sophia Brewer is a 34 y.o. female who presents for same day  (Constipation, Pt states has a tear, and a bubble on anus, states currently on her menstrual cycle, unsure if there's a lot of blood or not. Pt states she brought it up as a concern at her last visit but was told it wasn't much of a concerned and should be fine. ).    Subjective  34 year old lady here for ongoing constipation   Reports that since 2 days she has severe constipation, very hard stools , up to the point that after bowel movement she had a lot of rectal pain and while wiping some blood on tissue paper   At baseline constipation  She has hemorrhoids prior since pregnancy , but symptoms getting worse now   She is on her second day of menses today        Current Outpatient Medications   Medication Instructions    docusate sodium  (COLACE) 100 mg, oral, 2 times daily    famotidine  (PEPCID ) 20 mg, oral, Daily    hydrocortisone  (Anusol -HC) 2.5 % rectal cream rectal, 4 times daily PRN, Apply to affected areas    lidocaine  HCl-hydrocortison ac 3-0.5 % cream 1 Application, rectal, 2 times daily    omeprazole  (PRILOSEC ) 20 mg, oral, Daily PRN, Do not crush or chew.     Allergies[1]    Objective  Visit Vitals  BP 92/70 (BP Location: Left arm, Patient Position: Sitting, BP Cuff Size: Adult)   Pulse 90   Ht 1.6 m   Wt 66 kg   SpO2 99%   BMI 25.79 kg/m   OB Status Having periods   BSA 1.71 m     Physical Exam  Constitutional:       General: She is not in acute distress.  Cardiovascular:      Rate and Rhythm: Normal rate and regular rhythm.   Pulmonary:      Effort: Pulmonary effort is normal.   Abdominal:      General: There is no distension.      Palpations: Abdomen is soft.      Tenderness: There is no abdominal tenderness. There is no right CVA tenderness or left CVA tenderness.      Comments: Rectal  exam  External hemorrhoids +   Tenderness+  No active bleeding   Neurological:      Mental Status: She is alert and oriented to person, place, and time.   Psychiatric:         Behavior: Behavior normal.         Assessment / Plan  Shawnee was seen today for same day .  Other constipation  Comments:  encouraged high fibres in diet, adequate hydration. stool softner OTC, monitor sym , f/u pcp or Np in 2 weeks if sym gets worse  Orders:  -     docusate sodium  (Colace) 100 mg capsule; Take 1 capsule (100 mg) by mouth twice daily.  External hemorrhoid  Comments:  Advised seitz bath, preparation h , anusol  , lidocaine  topical. avoid constipation  Orders:  -     hydrocortisone  (Anusol -HC) 2.5 % rectal cream; Insert into the rectum if needed in the morning, at noon, in the evening, and at bedtime for hemorrhoids (rectal discomfort). Apply to affected areas  -  lidocaine  HCl-hydrocortison ac 3-0.5 % cream; Insert 1 Application into the rectum twice daily.  PR (bleeding per rectum)  Comments:  likely hemorroids vs fissure. treatement as above. will consider Gi eval or colorectal sx eval if sym not getting better in furture                                [1] No Known Allergies
